# Patient Record
Sex: Female | Born: 1995 | Race: White | Hispanic: Yes | Marital: Single | Smoking: Current every day smoker
Health system: Southern US, Community
[De-identification: ages and names within clinical notes are randomized; demographics above are authoritative.]

## PROBLEM LIST (undated history)

## (undated) DIAGNOSIS — R519 Headache, unspecified: Secondary | ICD-10-CM

## (undated) DIAGNOSIS — D6851 Activated protein C resistance: Secondary | ICD-10-CM

## (undated) DIAGNOSIS — C801 Malignant (primary) neoplasm, unspecified: Secondary | ICD-10-CM

## (undated) DIAGNOSIS — F419 Anxiety disorder, unspecified: Secondary | ICD-10-CM

## (undated) DIAGNOSIS — F329 Major depressive disorder, single episode, unspecified: Secondary | ICD-10-CM

## (undated) DIAGNOSIS — F410 Panic disorder [episodic paroxysmal anxiety] without agoraphobia: Secondary | ICD-10-CM

## (undated) DIAGNOSIS — F32A Depression, unspecified: Secondary | ICD-10-CM

## (undated) DIAGNOSIS — K219 Gastro-esophageal reflux disease without esophagitis: Secondary | ICD-10-CM

## (undated) HISTORY — PX: ABDOMINAL HYSTERECTOMY: SHX81

## (undated) HISTORY — PX: ENDOMETRIAL ABLATION W/ NOVASURE: SUR434

## (undated) HISTORY — PX: UPPER GI ENDOSCOPY: SHX6162

## (undated) HISTORY — PX: WISDOM TOOTH EXTRACTION: SHX21

## (undated) HISTORY — PX: MELANOMA EXCISION: SHX5266

---
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
## 2013-09-19 ENCOUNTER — Ambulatory Visit: Payer: Self-pay | Admitting: Gastroenterology

## 2013-09-19 LAB — HCG, QUANTITATIVE, PREGNANCY: Beta Hcg, Quant.: <1 m[IU]/mL — ABNORMAL LOW

## 2013-09-23 ENCOUNTER — Ambulatory Visit: Payer: Self-pay | Admitting: Gastroenterology

## 2013-09-25 LAB — PATHOLOGY REPORT

## 2014-11-04 ENCOUNTER — Other Ambulatory Visit: Payer: Self-pay | Admitting: Obstetrics and Gynecology

## 2014-11-04 NOTE — H&P (Signed)
Latoya Kelley is a 19 y.o. female here for Pain .  Referral from [facility] for hx of long-standing pelvic pain, with presumptive dx of endometriosis. Also hx of depression with SI/SA and depression. 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  Has a Skyla in place since 2013. Hx of same sex relationship, though is not currently sexually active. Her periods are very light and minimally crampy, which is a vast improvement over past pain and menorrhagia. She is not planning pregnancy in next 5 years at this point.  Present with grandmother Lelon Frohlich. Not in school currently though did do first semester at [employer].   Has considered Lupron in past. Has failed COC, POP and now Skyla, though her periods are very light, her pelvic pain is present. She desires dx surgery.  We did discuss Lupron may have theoretical risk of DVT, but that in absence of personal hx (and likely heterozygote) her risks are small. She has seen hematology in past, but they discharged her. They did place her on ASA which she takes intermittently.  Past Medical History:  has a past medical history of Factor V Leiden; Depression; and Dissociative identity disorder.  Past Surgical History:  has past surgical history that includes egd (09/23/2013). Family History: family history includes Depression in her paternal grandmother; Diabetes type II in her paternal grandmother; Factor V Leiden deficiency in her mother; Hypertension in her mother and paternal grandmother; Lung disease in her maternal grandmother. Social History:  reports that she has never smoked. She does not have any smokeless tobacco history on file. She reports that she does not drink alcohol or use illicit drugs. OB/GYN History:  OB History    Gravida Para Term Preterm AB TAB SAB Ectopic Multiple Living   0 0 0 0 0 0 0 0 0 0       Allergies: has No Known  Allergies. Medications:  Current outpatient prescriptions:  . escitalopram oxalate (LEXAPRO) 10 MG tablet, Take 2 tablets (20 mg total) by mouth once daily., Disp: 30 tablet, Rfl: 3 . gabapentin (NEURONTIN) 300 MG capsule, Take 300 mg by mouth 3 (three) times daily., Disp: , Rfl:  . hydrOXYzine (ATARAX) 25 MG tablet, Take 1 tablet (25 mg total) by mouth 3 (three) times daily as needed for Itching., Disp: 90 tablet, Rfl: prn . levonorgestrel (SKYLA) 14 mcg/24 hour (3 years) IUD IUD, Insert 1 each into the uterus once., Disp: , Rfl:  . meloxicam (MOBIC) 7.5 MG tablet, Take 1 tablet (7.5 mg total) by mouth once daily., Disp: 30 tablet, Rfl: 3 . OMEPRAZOLE ORAL, Take by mouth., Disp: , Rfl:  . OXcarbazepine (TRILEPTAL) 150 MG tablet, Take 150 mg by mouth 2 (two) times daily., Disp: , Rfl:  . paliperidone (INVEGA) 9 MG 24 hr tablet, Take 9 mg by mouth every morning., Disp: , Rfl:  . risperiDONE (RISPERDAL) 3 MG tablet, Take 3 mg by mouth once daily., Disp: , Rfl:  . topiramate (TOPAMAX) 25 MG tablet, Take 25 mg by mouth once daily., Disp: , Rfl:  . traMADol (ULTRAM) 50 mg tablet, Take 1 tablet (50 mg total) by mouth every 6 (six) hours as needed for Pain., Disp: 30 tablet, Rfl: 1 . traZODone (DESYREL) 50 MG tablet, Take 50 mg by mouth nightly., Disp: , Rfl:   Review of Systems: See HPI   Exam:   Filed Vitals:   11/04/14 1428  BP: 112/72  Pulse: 78  WDWN white female in NAD Body mass index is 39.73 kg/(m^2). Lungs: CTA  CV : RRR without murmur  Neck: no thyromegaly Abdomen: soft , no mass, normal active bowel sounds, non-tender, no rebound tenderness  Pelvic:  Deferred at patient's request  Impression:   The primary encounter diagnosis was Pelvic pain in female. Diagnoses of Factor V Leiden, Endometriosis, and Encounter for other general counseling or advice on contraception were also pertinent to this visit.    Plan:   - Pelvic  pain, ddx endometriosis: Per patient's request, interestd in dx laparoscopy. We did discuss her long-standing hx of pelvic pain, possible etiologies, what treatments she has tried. We reviewed her medical hx, surgical hx, risks with surgery. She has failed medical management.  Will replace Skyla with Mirena at the same time. Not interested in fertility in next several years, but levonorgestrel IUD helps with her AUB. She knows that not all women have improvement of sx after surgery, but I will remove any endometriosis I can find safely.  - Separately from the new patient evaluation for pelvic pain, Today we also had a preoperative discussion regarding her plans to proceed with surgical treatment of her suspected endometriosis by dx lap with fulguration procedure.Will also replace Skyla with Mirena. The patient and I discussed the technical aspects of the procedure including the potential for risks and complications. These include but are not limited to the risk of infection requiring post-operative antibiotics or further procedures. We talked about the risk of injury to adjacent organs including bladder, bowel, ureter, blood vessels or nerves. We talked about the need to convert to an open incision. We talked about the possible need for blood transfusion. We talked aboutpostop complications such asthromboembolic or cardiopulmonary complications. All of her questions were answered. Her preoperative exam was completed and the appropriate consents were signed. She is scheduled to undergo this procedure in the near future.  Stop the ASA 7 days prior to surgery. Plan for early ambulation after surgery. Plan for same day surgery.  RTC 2 weeks postop  Sheylin Scharnhorst Monika Salk, MD

## 2014-11-10 ENCOUNTER — Other Ambulatory Visit: Payer: Self-pay

## 2014-11-10 ENCOUNTER — Inpatient Hospital Stay: Admission: RE | Admit: 2014-11-10 | Payer: Self-pay | Source: Ambulatory Visit

## 2014-11-10 ENCOUNTER — Encounter: Payer: Self-pay | Admitting: *Deleted

## 2014-11-10 DIAGNOSIS — Z79899 Other long term (current) drug therapy: Secondary | ICD-10-CM | POA: Diagnosis not present

## 2014-11-10 DIAGNOSIS — D6851 Activated protein C resistance: Secondary | ICD-10-CM | POA: Diagnosis not present

## 2014-11-10 DIAGNOSIS — Z791 Long term (current) use of non-steroidal anti-inflammatories (NSAID): Secondary | ICD-10-CM | POA: Diagnosis not present

## 2014-11-10 DIAGNOSIS — R102 Pelvic and perineal pain: Secondary | ICD-10-CM | POA: Diagnosis present

## 2014-11-10 DIAGNOSIS — G8929 Other chronic pain: Secondary | ICD-10-CM | POA: Diagnosis not present

## 2014-11-10 DIAGNOSIS — F329 Major depressive disorder, single episode, unspecified: Secondary | ICD-10-CM | POA: Diagnosis not present

## 2014-11-10 DIAGNOSIS — K219 Gastro-esophageal reflux disease without esophagitis: Secondary | ICD-10-CM | POA: Diagnosis not present

## 2014-11-10 DIAGNOSIS — Z6838 Body mass index (BMI) 38.0-38.9, adult: Secondary | ICD-10-CM | POA: Diagnosis not present

## 2014-11-10 DIAGNOSIS — E669 Obesity, unspecified: Secondary | ICD-10-CM | POA: Diagnosis not present

## 2014-11-10 DIAGNOSIS — F17200 Nicotine dependence, unspecified, uncomplicated: Secondary | ICD-10-CM | POA: Diagnosis not present

## 2014-11-10 NOTE — Patient Instructions (Signed)
  Your procedure is scheduled on:11/14/14 Report to Day Surgery. To find out your arrival time please call [phone] between 1PM - 3PM on 11/13/14.  Remember: Instructions that are not followed completely may result in serious medical risk, up to and including death, or upon the discretion of your surgeon and anesthesiologist your surgery may need to be rescheduled.    _x___ 1. Do not eat food or drink liquids after midnight. No gum chewing or hard candies.     __x__ 2. No Alcohol for 24 hours before or after surgery.   ____ 3. Bring all medications with you on the day of surgery if instructed.    __x__ 4. Notify your doctor if there is any change in your medical condition     (cold, fever, infections).     Do not wear jewelry, make-up, hairpins, clips or nail polish.  Do not wear lotions, powders, or perfumes. You may wear deodorant.  Do not shave 48 hours prior to surgery. Men may shave face and neck.  Do not bring valuables to the hospital.    [facility] is not responsible for any belongings or valuables.               Contacts, dentures or bridgework may not be worn into surgery.  Leave your suitcase in the car. After surgery it may be brought to your room.  For patients admitted to the hospital, discharge time is determined by your                treatment team.   Patients discharged the day of surgery will not be allowed to drive home.   Please read over the following fact sheets that you were given:   Surgical Site Infection Prevention   ____ Take these medicines the morning of surgery with A SIP OF WATER:    1.   2.   3.   4.  5.  6.  ____ Fleet Enema (as directed)   __x__ Use CHG Soap as directed  ____ Use inhalers on the day of surgery  ____ Stop metformin 2 days prior to surgery    ____ Take 1/2 of usual insulin dose the night before surgery and none on the morning of surgery.   ____ Stop Coumadin/Plavix/aspirin on ____ Stop Anti-inflammatories on     ____ Stop supplements until after surgery.    ____ Bring C-Pap to the hospital.

## 2014-11-10 NOTE — OR Nursing (Signed)
Dr Myra Gianotti notified of factor 5 leiden and patient states hematologist did not recommend followup unless issues experienced with bleeding

## 2014-11-13 ENCOUNTER — Encounter
Admission: RE | Admit: 2014-11-13 | Discharge: 2014-11-13 | Disposition: A | Discharge status: Home / Self Care | Payer: Managed Care, Other (non HMO) | Source: Ambulatory Visit | Attending: Obstetrics and Gynecology | Admitting: Obstetrics and Gynecology

## 2014-11-13 DIAGNOSIS — G8929 Other chronic pain: Secondary | ICD-10-CM | POA: Diagnosis not present

## 2014-11-13 LAB — BASIC METABOLIC PANEL
Anion gap: 8 (ref 5–15)
BUN: 13 mg/dL (ref 6–20)
CO2: 23 mmol/L (ref 22–32)
CREATININE: 0.86 mg/dL (ref 0.44–1.00)
Calcium: 8.6 mg/dL — ABNORMAL LOW (ref 8.9–10.3)
Chloride: 105 mmol/L (ref 101–111)
GFR calc Af Amer: >60 mL/min (ref >60)
Glucose, Bld: 88 mg/dL (ref 65–99)
POTASSIUM: 3.9 mmol/L (ref 3.5–5.1)
Sodium: 136 mmol/L (ref 135–145)

## 2014-11-13 LAB — CBC
HCT: 46.1 % (ref 35.0–47.0)
Hemoglobin: 15 g/dL (ref 12.0–16.0)
MCH: 29.1 pg (ref 26.0–34.0)
MCHC: 32.5 g/dL (ref 32.0–36.0)
MCV: 89.4 fL (ref 80.0–100.0)
PLATELETS: 267 10*3/uL (ref 150–440)
RBC: 5.16 MIL/uL (ref 3.80–5.20)
RDW: 13.5 % (ref 11.5–14.5)
WBC: 8 10*3/uL (ref 3.6–11.0)

## 2014-11-13 LAB — TYPE AND SCREEN
ABO/RH(D): A NEG
Antibody Screen: NEGATIVE

## 2014-11-13 LAB — PREGNANCY, URINE: PREG TEST UR: NEGATIVE

## 2014-11-14 ENCOUNTER — Encounter: Payer: Self-pay | Admitting: *Deleted

## 2014-11-14 ENCOUNTER — Encounter
Admission: RE | Disposition: A | Discharge status: Home / Self Care | Payer: Self-pay | Source: Ambulatory Visit | Attending: Obstetrics and Gynecology

## 2014-11-14 ENCOUNTER — Ambulatory Visit: Payer: Managed Care, Other (non HMO) | Admitting: Anesthesiology

## 2014-11-14 ENCOUNTER — Ambulatory Visit
Admission: RE | Admit: 2014-11-14 | Discharge: 2014-11-14 | Disposition: A | Discharge status: Home / Self Care | Payer: Managed Care, Other (non HMO) | Source: Ambulatory Visit | Attending: Obstetrics and Gynecology | Admitting: Obstetrics and Gynecology

## 2014-11-14 DIAGNOSIS — Z791 Long term (current) use of non-steroidal anti-inflammatories (NSAID): Secondary | ICD-10-CM | POA: Insufficient documentation

## 2014-11-14 DIAGNOSIS — F17200 Nicotine dependence, unspecified, uncomplicated: Secondary | ICD-10-CM | POA: Insufficient documentation

## 2014-11-14 DIAGNOSIS — D6851 Activated protein C resistance: Secondary | ICD-10-CM | POA: Insufficient documentation

## 2014-11-14 DIAGNOSIS — G8929 Other chronic pain: Secondary | ICD-10-CM | POA: Diagnosis not present

## 2014-11-14 DIAGNOSIS — Z79899 Other long term (current) drug therapy: Secondary | ICD-10-CM | POA: Insufficient documentation

## 2014-11-14 DIAGNOSIS — E669 Obesity, unspecified: Secondary | ICD-10-CM | POA: Insufficient documentation

## 2014-11-14 DIAGNOSIS — R102 Pelvic and perineal pain: Secondary | ICD-10-CM | POA: Insufficient documentation

## 2014-11-14 DIAGNOSIS — K219 Gastro-esophageal reflux disease without esophagitis: Secondary | ICD-10-CM | POA: Insufficient documentation

## 2014-11-14 DIAGNOSIS — F329 Major depressive disorder, single episode, unspecified: Secondary | ICD-10-CM | POA: Insufficient documentation

## 2014-11-14 DIAGNOSIS — Z6838 Body mass index (BMI) 38.0-38.9, adult: Secondary | ICD-10-CM | POA: Insufficient documentation

## 2014-11-14 HISTORY — PX: IUD REMOVAL: SHX5392

## 2014-11-14 HISTORY — PX: LAPAROSCOPY: SHX197

## 2014-11-14 HISTORY — DX: Depression, unspecified: F32.A

## 2014-11-14 HISTORY — DX: Gastro-esophageal reflux disease without esophagitis: K21.9

## 2014-11-14 HISTORY — DX: Major depressive disorder, single episode, unspecified: F32.9

## 2014-11-14 HISTORY — PX: INTRAUTERINE DEVICE (IUD) INSERTION: SHX5877

## 2014-11-14 HISTORY — DX: Anxiety disorder, unspecified: F41.9

## 2014-11-14 HISTORY — DX: Activated protein C resistance: D68.51

## 2014-11-14 HISTORY — DX: Panic disorder (episodic paroxysmal anxiety): F41.0

## 2014-11-14 LAB — ABO/RH: ABO/RH(D): A NEG

## 2014-11-14 LAB — POCT PREGNANCY, URINE: PREG TEST UR: NEGATIVE

## 2014-11-14 SURGERY — REMOVAL, INTRAUTERINE DEVICE
Anesthesia: General

## 2014-11-14 MED ORDER — LACTATED RINGERS IV SOLN
INTRAVENOUS | Status: DC
  Administered 2014-11-14: 10:00:00 via INTRAVENOUS

## 2014-11-14 MED ORDER — ONDANSETRON HCL 4 MG/2ML IJ SOLN
INTRAMUSCULAR | Status: AC
  Administered 2014-11-14: 4 mg via INTRAVENOUS
  Filled 2014-11-14: qty 2

## 2014-11-14 MED ORDER — BUPIVACAINE HCL 0.5 % IJ SOLN
INTRAMUSCULAR | Status: DC | PRN
  Administered 2014-11-14: 10 mL

## 2014-11-14 MED ORDER — GLYCOPYRROLATE 0.2 MG/ML IJ SOLN
INTRAMUSCULAR | Status: DC | PRN
  Administered 2014-11-14: 0.6 mg via INTRAVENOUS

## 2014-11-14 MED ORDER — SILVER NITRATE-POT NITRATE 75-25 % EX MISC
CUTANEOUS | Status: AC
  Filled 2014-11-14: qty 4

## 2014-11-14 MED ORDER — FENTANYL CITRATE (PF) 100 MCG/2ML IJ SOLN
INTRAMUSCULAR | Status: AC
  Administered 2014-11-14: 25 ug via INTRAVENOUS
  Filled 2014-11-14: qty 2

## 2014-11-14 MED ORDER — DOCUSATE SODIUM 100 MG PO CAPS: 100.0000 mg | ORAL_CAPSULE | Freq: Two times a day (BID) | ORAL | Status: DC | PRN

## 2014-11-14 MED ORDER — KETOROLAC TROMETHAMINE 30 MG/ML IJ SOLN
30.0000 mg | Freq: Once | INTRAMUSCULAR | Status: AC
  Administered 2014-11-14: 30 mg via INTRAVENOUS

## 2014-11-14 MED ORDER — ONDANSETRON HCL 4 MG/2ML IJ SOLN
4.0000 mg | Freq: Once | INTRAMUSCULAR | Status: AC | PRN
  Administered 2014-11-14: 4 mg via INTRAVENOUS

## 2014-11-14 MED ORDER — IBUPROFEN 800 MG PO TABS: 800.0000 mg | ORAL_TABLET | Freq: Three times a day (TID) | ORAL | Status: DC | PRN

## 2014-11-14 MED ORDER — FAMOTIDINE 20 MG PO TABS
ORAL_TABLET | ORAL | Status: AC
  Administered 2014-11-14: 20 mg via ORAL
  Filled 2014-11-14: qty 1

## 2014-11-14 MED ORDER — MIDAZOLAM HCL 2 MG/2ML IJ SOLN
INTRAMUSCULAR | Status: DC | PRN
Start: 2014-11-14 — End: 2014-11-14
  Administered 2014-11-14 (×2): 1 mg via INTRAVENOUS

## 2014-11-14 MED ORDER — LACTATED RINGERS IV SOLN
INTRAVENOUS | Status: DC
  Administered 2014-11-14: 09:00:00 via INTRAVENOUS

## 2014-11-14 MED ORDER — FAMOTIDINE 20 MG PO TABS
20.0000 mg | ORAL_TABLET | Freq: Once | ORAL | Status: AC
  Administered 2014-11-14: 20 mg via ORAL

## 2014-11-14 MED ORDER — OXYCODONE-ACETAMINOPHEN 5-325 MG PO TABS: 1.0000 | ORAL_TABLET | Freq: Four times a day (QID) | ORAL | Status: DC | PRN

## 2014-11-14 MED ORDER — LIDOCAINE HCL (CARDIAC) 20 MG/ML IV SOLN
INTRAVENOUS | Status: DC | PRN
  Administered 2014-11-14: 40 mg via INTRAVENOUS

## 2014-11-14 MED ORDER — FENTANYL CITRATE (PF) 100 MCG/2ML IJ SOLN
25.0000 ug | INTRAMUSCULAR | Status: AC | PRN
  Administered 2014-11-14 (×6): 25 ug via INTRAVENOUS

## 2014-11-14 MED ORDER — PROPOFOL 10 MG/ML IV BOLUS
INTRAVENOUS | Status: DC | PRN
  Administered 2014-11-14: 200 mg via INTRAVENOUS

## 2014-11-14 MED ORDER — KETOROLAC TROMETHAMINE 30 MG/ML IJ SOLN
INTRAMUSCULAR | Status: AC
  Administered 2014-11-14: 30 mg via INTRAVENOUS
  Filled 2014-11-14: qty 1

## 2014-11-14 MED ORDER — FENTANYL CITRATE (PF) 100 MCG/2ML IJ SOLN
INTRAMUSCULAR | Status: DC | PRN
  Administered 2014-11-14 (×2): 50 ug via INTRAVENOUS

## 2014-11-14 MED ORDER — BUPIVACAINE HCL (PF) 0.5 % IJ SOLN
INTRAMUSCULAR | Status: AC
  Filled 2014-11-14: qty 30

## 2014-11-14 MED ORDER — ROCURONIUM BROMIDE 100 MG/10ML IV SOLN
INTRAVENOUS | Status: DC | PRN
  Administered 2014-11-14: 40 mg via INTRAVENOUS

## 2014-11-14 MED ORDER — NEOSTIGMINE METHYLSULFATE 10 MG/10ML IV SOLN
INTRAVENOUS | Status: DC | PRN
  Administered 2014-11-14: 3 mg via INTRAVENOUS

## 2014-11-14 SURGICAL SUPPLY — 38 items
BAG URO DRAIN 2000ML W/SPOUT (MISCELLANEOUS) ×2 IMPLANT
BLADE SURG SZ11 CARB STEEL (BLADE) ×2 IMPLANT
CATH FOLEY 2WAY  5CC 16FR (CATHETERS) ×1
CATH ROBINSON RED A/P 16FR (CATHETERS) ×2 IMPLANT
CATH URTH 16FR FL 2W BLN LF (CATHETERS) ×1 IMPLANT
CHLORAPREP W/TINT 26ML (MISCELLANEOUS) ×2 IMPLANT
DRSG TEGADERM 2-3/8X2-3/4 SM (GAUZE/BANDAGES/DRESSINGS) ×2 IMPLANT
ENDOPOUCH RETRIEVER 10 (MISCELLANEOUS) IMPLANT
GAUZE SPONGE NON-WVN 2X2 STRL (MISCELLANEOUS) ×1 IMPLANT
GLOVE BIO SURGEON STRL SZ 6.5 (GLOVE) ×6 IMPLANT
GLOVE BIO SURGEON STRL SZ7 (GLOVE) ×2 IMPLANT
GLOVE INDICATOR 6.5 STRL GRN (GLOVE) ×2 IMPLANT
GLOVE INDICATOR 7.0 STRL GRN (GLOVE) ×6 IMPLANT
GOWN STRL REUS W/ TWL LRG LVL3 (GOWN DISPOSABLE) ×2 IMPLANT
GOWN STRL REUS W/TWL LRG LVL3 (GOWN DISPOSABLE) ×2
IRRIGATION STRYKERFLOW (MISCELLANEOUS) ×1 IMPLANT
IRRIGATOR STRYKERFLOW (MISCELLANEOUS) ×2
KIT RM TURNOVER CYSTO AR (KITS) ×2 IMPLANT
LABEL OR SOLS (LABEL) ×2 IMPLANT
NS IRRIG 500ML POUR BTL (IV SOLUTION) ×2 IMPLANT
PACK DNC HYST (MISCELLANEOUS) ×2 IMPLANT
PACK GYN LAPAROSCOPIC (MISCELLANEOUS) ×2 IMPLANT
PAD OB MATERNITY 4.3X12.25 (PERSONAL CARE ITEMS) ×2 IMPLANT
PAD PREP 24X41 OB/GYN DISP (PERSONAL CARE ITEMS) ×2 IMPLANT
SHEARS HARMONIC ACE PLUS 36CM (ENDOMECHANICALS) ×2 IMPLANT
SLEEVE ENDOPATH XCEL 5M (ENDOMECHANICALS) ×4 IMPLANT
SPONGE VERSALON 2X2 STRL (MISCELLANEOUS) ×1
STRIP CLOSURE SKIN 1/4X4 (GAUZE/BANDAGES/DRESSINGS) ×2 IMPLANT
SUT VIC AB 2-0 UR6 27 (SUTURE) IMPLANT
SUT VIC AB 4-0 SH 27 (SUTURE) ×1
SUT VIC AB 4-0 SH 27XANBCTRL (SUTURE) ×1 IMPLANT
SWABSTK COMLB BENZOIN TINCTURE (MISCELLANEOUS) ×2 IMPLANT
TOWEL OR 17X26 4PK STRL BLUE (TOWEL DISPOSABLE) ×2 IMPLANT
TROCAR 5M 150ML BLDLS (TROCAR) ×2 IMPLANT
TROCAR ENDO BLADELESS 11MM (ENDOMECHANICALS) IMPLANT
TROCAR XCEL NON-BLD 5MMX100MML (ENDOMECHANICALS) ×2 IMPLANT
TROCAR XCEL UNIV SLVE 11M 100M (ENDOMECHANICALS) IMPLANT
TUBING INSUFFLATOR HI FLOW (MISCELLANEOUS) ×2 IMPLANT

## 2014-11-14 NOTE — Transfer of Care (Signed)
Immediate Anesthesia Transfer of Care Note  Patient: Latoya Kelley  Procedure(s) Performed: Procedure(s): INTRAUTERINE DEVICE (IUD) REMOVAL (N/A) INTRAUTERINE DEVICE (IUD) INSERTION (N/A) LAPAROSCOPY DIAGNOSTIC (N/A)  Patient Location: PACU  Anesthesia Type:General  Level of Consciousness: awake, oriented and patient cooperative  Airway & Oxygen Therapy: Patient Spontanous Breathing and Patient connected to face mask oxygen  Post-op Assessment: Report given to RN and Post -op Vital signs reviewed and stable  Post vital signs: Reviewed  Last Vitals:  Filed Vitals:   11/14/14 1054  BP: 115/85  Pulse: 78  Temp: 36.1 C  Resp: 21    Complications: No apparent anesthesia complications

## 2014-11-14 NOTE — Discharge Instructions (Signed)
Diagnostic Laparoscopy Laparoscopy is a surgical procedure. It is used to diagnose and treat diseases inside the belly (abdomen). It is usually a brief, common, and relatively simple procedure. The laparoscopeis a thin, lighted, pencil-sized instrument. It is like a telescope. It is inserted into your abdomen through a small cut (incision). Your caregiver can look at the organs inside your body through this instrument. He or she can see if there is anything abnormal. Laparoscopy can be done either in a hospital or outpatient clinic. You may be given a mild sedative to help you relax before the procedure. Once in the operating room, you will be given a drug to make you sleep (general anesthesia). Laparoscopy usually lasts less than 1 hour. After the procedure, you will be monitored in a recovery area until you are stable and doing well. Once you are home, it will take 2 to 3 days to fully recover. RISKS AND COMPLICATIONS  Laparoscopy has relatively few risks. Your caregiver will discuss the risks with you before the procedure. Some problems that can occur include:  Infection.  Bleeding.  Damage to other organs.  Anesthetic side effects. PROCEDURE Once you receive anesthesia, your surgeon inflates the abdomen with a harmless gas (carbon dioxide). This makes the organs easier to see. The laparoscope is inserted into the abdomen through a small incision. This allows your surgeon to see into the abdomen. Other small instruments are also inserted into the abdomen through other small openings. Many surgeons attach a video camera to the laparoscope to enlarge the view. During a diagnostic laparoscopy, the surgeon may be looking for inflammation, infection, or cancer. Your surgeon may take tissue samples(biopsies). The samples are sent to a specialist in looking at cells and tissue samples (pathologist). The pathologist examines them under a microscope. Biopsies can help to diagnose or confirm a  disease. AFTER THE PROCEDURE   The gas is released from inside the abdomen.  The incisions are closed with stitches (sutures). Because these incisions are small (usually less than 1/2 inch), there is usually minimal discomfort after the procedure. There may be some mild discomfort in the throat. This is from the tube placed in the throat while you were sleeping. You may have some mild abdominal discomfort. There may also be discomfort from the instrument placement incisions in the abdomen.  The recovery time is shortened as long as there are no complications.  You will rest in a recovery room until stable and doing well. As long as there are no complications, you may be allowed to go home. FINDING OUT THE RESULTS OF YOUR TEST Not all test results are available during your visit. If your test results are not back during the visit, make an appointment with your caregiver to find out the results. Do not assume everything is normal if you have not heard from your caregiver or the medical facility. It is important for you to follow up on all of your test results.  HOME CARE INSTRUCTIONS   Take all medicines as directed.  Only take over-the-counter or prescription medicines for pain, discomfort, or fever as directed by your caregiver.  Resume daily activities as directed.  Showers are preferred over baths.  You may resume sexual activities in 1 week or as directed.  Do not drive while taking narcotics. SEEK MEDICAL CARE IF:   There is increasing abdominal pain.  There is new pain in the shoulders (shoulder strap areas).  You feel lightheaded or faint.  You have the chills.  You  or your child has an oral temperature above 102 F (38.9 C).  There is pus-like (purulent) drainage from any of the wounds.  You are unable to pass gas or have a bowel movement.  You feel sick to your stomach (nauseous) or throw up (vomit). MAKE SURE YOU:   Understand these instructions.  Will watch  your condition.  Will get help right away if you are not doing well or get worse. Document Released: 08/15/2000 Document Revised: 09/03/2012 Document Reviewed: 05/09/2007 [facility] Patient Information 2015 [place], [place]. This information is not intended to replace advice given to you by your health care provider. Make sure you discuss any questions you have with your health care provider.  General Anesthesia, Care After Refer to this sheet in the next few weeks. These instructions provide you with information on caring for yourself after your procedure. Your health care provider may also give you more specific instructions. Your treatment has been planned according to current medical practices, but problems sometimes occur. Call your health care provider if you have any problems or questions after your procedure. WHAT TO EXPECT AFTER THE PROCEDURE After the procedure, it is typical to experience: Sleepiness. Nausea and vomiting. HOME CARE INSTRUCTIONS For the first 24 hours after general anesthesia: Have a responsible person with you. Do not drive a car. If you are alone, do not take public transportation. Do not drink alcohol. Do not take medicine that has not been prescribed by your health care provider. Do not sign important papers or make important decisions. You may resume a normal diet and activities as directed by your health care provider. Change bandages (dressings) as directed. If you have questions or problems that seem related to general anesthesia, call the hospital and ask for the anesthetist or anesthesiologist on call. SEEK MEDICAL CARE IF: You have nausea and vomiting that continue the day after anesthesia. You develop a rash. SEEK IMMEDIATE MEDICAL CARE IF:  You have difficulty breathing. You have chest pain. You have any allergic problems. Document Released: 08/15/2000 Document Revised: 05/14/2013 Document Reviewed: 11/22/2012 [facility] Patient Information 2015  [place], [place]. This information is not intended to replace advice given to you by your health care provider. Make sure you discuss any questions you have with your health care provider.

## 2014-11-14 NOTE — Anesthesia Procedure Notes (Signed)
Procedure Name: Intubation Date/Time: 11/14/2014 9:56 AM Performed by: Courtney Paris Pre-anesthesia Checklist: Patient identified, Emergency Drugs available, Suction available, Patient being monitored and Timeout performed Patient Re-evaluated:Patient Re-evaluated prior to inductionOxygen Delivery Method: Circle system utilized Preoxygenation: Pre-oxygenation with 100% oxygen Intubation Type: Combination inhalational/ intravenous induction and IV induction Ventilation: Mask ventilation without difficulty Laryngoscope Size: Mac and 4 Grade View: Grade II Tube type: Oral Tube size: 7.0 mm Number of attempts: 2 Airway Equipment and Method: Stylet Placement Confirmation: ETT inserted through vocal cords under direct vision,  positive ETCO2,  CO2 detector and breath sounds checked- equal and bilateral Secured at: 20 cm Tube secured with: Tape Dental Injury: Teeth and Oropharynx as per pre-operative assessment  Difficulty Due To: Difficult Airway- due to large tongue

## 2014-11-14 NOTE — Op Note (Signed)
Latoya Kelley PROCEDURE DATE: 11/14/2014  PREOPERATIVE DIAGNOSIS: Chronic pelvic pain POSTOPERATIVE DIAGNOSIS:  Same PROCEDURE:  - Diagnostic laparoscopy - IUD removal - IUD placement  SURGEON:  Dr. Benjaman Kindler  INDICATIONS: 19 y.o. G0 with history of chronic pelvic pain concerning for endometriosis desiring surgical evaluation.   Please see preoperative notes for further details.  Risks of surgery were discussed with the patient including but not limited to: bleeding which may require transfusion or reoperation; infection which may require antibiotics; injury to bowel, bladder, ureters or other surrounding organs; need for additional procedures including laparotomy; thromboembolic phenomenon, incisional problems and other postoperative/anesthesia complications. Written informed consent was obtained.    FINDINGS:  Normal ovaries and fallopian tubes bilaterally.  No evidence of erythema, peritoneal implants or endometriosis noted in pelvis or abdomen.  The uterus appeared to have globular enlargement with doughy appearance. No other abdominal/pelvic abnormality.  Normal upper abdomen. Normal appendix.   ANESTHESIA:    General INTRAVENOUS FLUIDS: 600 ml ESTIMATED BLOOD LOSS: minimal URINE OUTPUT: 200 ml SPECIMENS: None COMPLICATIONS: None immediate  PROCEDURE IN DETAIL:  The patient had sequential compression devices applied to her lower extremities while in the preoperative area.  She was then taken to the operating room where general anesthesia was administered and was found to be adequate.  She was placed in the dorsal lithotomy position, and was prepped and draped in a sterile manner.    After an adequate timeout was performed, a Foley catheter was inserted into her bladder and attached to constant drainage. The strings of the Skyla were visualized and grasped with ring forceps. IUD was removed without difficulty. A uterine manipulator was then advanced into the uterus .     Attention was turned to the abdomen where an umbilical incision was made with the scalpel.  The Optiview 11-mm trocar and sleeve were then advanced without difficulty with the laparoscope under direct visualization into the abdomen.  The abdomen was then insufflated with carbon dioxide gas and adequate pneumoperitoneum was obtained.   A detailed survey of the patient's pelvis and abdomen revealed the findings as mentioned above.  The operative site was surveyed, and it was found to be hemostatic.  No intraoperative injury to surrounding organs was noted.  The abdomen was desufflated and all instruments were then removed from the patient's abdomen. The uterine manipulator was removed without complications.  All incisions were closed with Dermabond. The patient tolerated the procedures well.  All instruments, needles, and sponge counts were correct x 2. The patient was taken to the recovery room in stable condition.

## 2014-11-14 NOTE — OR Nursing (Signed)
Mirena, 52mg , levonorgestrel-releasing intrauterine system Lot:  [id number], exp 04/2017

## 2014-11-14 NOTE — Anesthesia Postprocedure Evaluation (Signed)
  Anesthesia Post-op Note  Patient: Latoya Kelley  Procedure(s) Performed: Procedure(s): INTRAUTERINE DEVICE (IUD) REMOVAL (N/A) INTRAUTERINE DEVICE (IUD) INSERTION (N/A) LAPAROSCOPY DIAGNOSTIC (N/A)  Anesthesia type:General  Patient location: PACU  Post pain: Pain level controlled  Post assessment: Post-op Vital signs reviewed, Patient's Cardiovascular Status Stable, Respiratory Function Stable, Patent Airway and No signs of Nausea or vomiting  Post vital signs: Reviewed and stable  Last Vitals:  Filed Vitals:   11/14/14 1058  BP:   Pulse: 77  Temp:   Resp: 17    Level of consciousness: awake, alert  and patient cooperative  Complications: No apparent anesthesia complications

## 2014-11-14 NOTE — Anesthesia Preprocedure Evaluation (Addendum)
Anesthesia Evaluation  Patient identified by MRN, date of birth, ID band Patient awake    Reviewed: Allergy & Precautions, NPO status , Patient's Chart, lab work & pertinent test results  History of Anesthesia Complications Negative for: history of anesthetic complications  Airway Mallampati: II       Dental no notable dental hx.    Pulmonary Current Smoker,    Pulmonary exam normal       Cardiovascular negative cardio ROS Normal cardiovascular exam    Neuro/Psych negative neurological ROS     GI/Hepatic Neg liver ROS, GERD-  ,  Endo/Other  negative endocrine ROS  Renal/GU negative Renal ROS  negative genitourinary   Musculoskeletal negative musculoskeletal ROS (+)   Abdominal (+) + obese,   Peds negative pediatric ROS (+)  Hematology negative hematology ROS (+)   Anesthesia Other Findings   Reproductive/Obstetrics negative OB ROS                            Anesthesia Physical Anesthesia Plan  ASA: II  Anesthesia Plan: General   Post-op Pain Management:    Induction: Intravenous  Airway Management Planned: Oral ETT  Additional Equipment:   Intra-op Plan:   Post-operative Plan: Extubation in OR  Informed Consent: I have reviewed the patients History and Physical, chart, labs and discussed the procedure including the risks, benefits and alternatives for the proposed anesthesia with the patient or authorized representative who has indicated his/her understanding and acceptance.     Plan Discussed with: CRNA  Anesthesia Plan Comments:        Anesthesia Quick Evaluation

## 2014-11-14 NOTE — H&P (Signed)
  Date of Initial H&P: 09/30/14  History reviewed, patient examined, no change in status, stable for surgery.  Paper chart H&P completed and will be scanned into EMR.

## 2015-06-12 ENCOUNTER — Ambulatory Visit: Payer: Commercial Managed Care - PPO | Attending: Obstetrics and Gynecology | Admitting: Physical Therapy

## 2015-06-12 ENCOUNTER — Encounter: Payer: Self-pay | Admitting: Physical Therapy

## 2015-06-12 DIAGNOSIS — M5416 Radiculopathy, lumbar region: Secondary | ICD-10-CM | POA: Insufficient documentation

## 2015-06-12 DIAGNOSIS — R29898 Other symptoms and signs involving the musculoskeletal system: Secondary | ICD-10-CM | POA: Diagnosis present

## 2015-06-12 DIAGNOSIS — M791 Myalgia: Secondary | ICD-10-CM | POA: Diagnosis present

## 2015-06-12 DIAGNOSIS — R279 Unspecified lack of coordination: Secondary | ICD-10-CM

## 2015-06-12 DIAGNOSIS — IMO0001 Reserved for inherently not codable concepts without codable children: Secondary | ICD-10-CM

## 2015-06-12 DIAGNOSIS — M609 Myositis, unspecified: Secondary | ICD-10-CM | POA: Insufficient documentation

## 2015-06-12 NOTE — Patient Instructions (Signed)
Bend backwards 10x 3 at every 2 hrs for the next 48-72 hr until the radiating back pain moves up and stays at the center of the low back.     Log rolling out of bed
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
## 2015-06-15 NOTE — Addendum Note (Signed)
Addended by: Jerl Mina on: 06/15/2015 10:22 PM   Modules accepted: Orders

## 2015-06-24 ENCOUNTER — Ambulatory Visit: Payer: Commercial Managed Care - PPO | Attending: Obstetrics and Gynecology | Admitting: Physical Therapy

## 2015-06-26 ENCOUNTER — Ambulatory Visit: Payer: Commercial Managed Care - PPO | Admitting: Physical Therapy

## 2015-06-30 ENCOUNTER — Encounter: Payer: Commercial Managed Care - PPO | Admitting: Physical Therapy

## 2015-07-02 ENCOUNTER — Encounter: Payer: Commercial Managed Care - PPO | Admitting: Physical Therapy

## 2015-07-08 ENCOUNTER — Encounter: Payer: Commercial Managed Care - PPO | Admitting: Physical Therapy

## 2015-07-15 ENCOUNTER — Encounter: Payer: Commercial Managed Care - PPO | Admitting: Physical Therapy

## 2015-07-22 ENCOUNTER — Encounter: Payer: Commercial Managed Care - PPO | Admitting: Physical Therapy

## 2015-07-29 ENCOUNTER — Encounter: Payer: Commercial Managed Care - PPO | Admitting: Physical Therapy

## 2015-08-05 ENCOUNTER — Encounter: Payer: Commercial Managed Care - PPO | Admitting: Physical Therapy

## 2015-08-12 ENCOUNTER — Encounter: Payer: Commercial Managed Care - PPO | Admitting: Physical Therapy

## 2016-10-26 ENCOUNTER — Encounter: Payer: Self-pay | Admitting: Hematology and Oncology

## 2016-10-26 ENCOUNTER — Inpatient Hospital Stay: Payer: Managed Care, Other (non HMO)

## 2016-10-26 ENCOUNTER — Inpatient Hospital Stay: Payer: Managed Care, Other (non HMO) | Attending: Hematology and Oncology | Admitting: Hematology and Oncology

## 2016-10-26 VITALS — BP 126/79 | HR 112 | Temp 96.9°F | Resp 18 | Wt 218.9 lb

## 2016-10-26 DIAGNOSIS — N393 Stress incontinence (female) (male): Secondary | ICD-10-CM | POA: Insufficient documentation

## 2016-10-26 DIAGNOSIS — N920 Excessive and frequent menstruation with regular cycle: Secondary | ICD-10-CM | POA: Diagnosis not present

## 2016-10-26 DIAGNOSIS — K219 Gastro-esophageal reflux disease without esophagitis: Secondary | ICD-10-CM | POA: Diagnosis not present

## 2016-10-26 DIAGNOSIS — R6881 Early satiety: Secondary | ICD-10-CM

## 2016-10-26 DIAGNOSIS — F410 Panic disorder [episodic paroxysmal anxiety] without agoraphobia: Secondary | ICD-10-CM | POA: Diagnosis not present

## 2016-10-26 DIAGNOSIS — D6851 Activated protein C resistance: Secondary | ICD-10-CM | POA: Diagnosis not present

## 2016-10-26 DIAGNOSIS — Z79899 Other long term (current) drug therapy: Secondary | ICD-10-CM | POA: Diagnosis not present

## 2016-10-26 DIAGNOSIS — F17210 Nicotine dependence, cigarettes, uncomplicated: Secondary | ICD-10-CM | POA: Diagnosis not present

## 2016-10-26 DIAGNOSIS — D751 Secondary polycythemia: Secondary | ICD-10-CM | POA: Diagnosis not present

## 2016-10-26 DIAGNOSIS — R634 Abnormal weight loss: Secondary | ICD-10-CM

## 2016-10-26 DIAGNOSIS — F329 Major depressive disorder, single episode, unspecified: Secondary | ICD-10-CM | POA: Diagnosis not present

## 2016-10-26 LAB — COMPREHENSIVE METABOLIC PANEL
ALT: 15 U/L (ref 14–54)
AST: 23 U/L (ref 15–41)
Albumin: 4 g/dL (ref 3.5–5.0)
Alkaline Phosphatase: 48 U/L (ref 38–126)
Anion gap: 9 (ref 5–15)
BUN: 5 mg/dL — ABNORMAL LOW (ref 6–20)
CO2: 22 mmol/L (ref 22–32)
Calcium: 9 mg/dL (ref 8.9–10.3)
Chloride: 104 mmol/L (ref 101–111)
Creatinine, Ser: 0.86 mg/dL (ref 0.44–1.00)
GFR calc Af Amer: >60 mL/min (ref >60)
GFR calc non Af Amer: >60 mL/min (ref >60)
Glucose, Bld: 117 mg/dL — ABNORMAL HIGH (ref 65–99)
Potassium: 3.7 mmol/L (ref 3.5–5.1)
Sodium: 135 mmol/L (ref 135–145)
Total Bilirubin: 0.4 mg/dL (ref 0.3–1.2)
Total Protein: 7 g/dL (ref 6.5–8.1)

## 2016-10-26 LAB — FERRITIN: Ferritin: 21 ng/mL (ref 11–307)

## 2016-10-26 NOTE — Progress Notes (Signed)
[facility] day:  10/26/2016  Chief Complaint: Latoya Kelley is a 21 y.o. female with polycythemia who is referred by Ricardo Jericho, NP for assessment and management.  HPI:  The patient states that she found out she had increased red blood cells when she was called to set up this appointment. She states that her blood has been checked in the past and was normal.  CBC on 11/13/2014 revealed a hematocrit of 46.1, hemoglobin 15, MCV 89.4, platelets 267,000 and white count 8000. CBC on 09/23/2016 revealed a hematocrit of 49.1, hemoglobin 16.5, MCV 93, platelets 276,000, white count 8600 with an [place] of 4500. Differential was unremarkable. CBC on 09/29/2016 revealed a hematocrit of 48.3, hemoglobin 16.3, MCV 92, platelets 271,000 and white count 7900.  The patient states that she was diagnosed with Factor V Leiden at the age of 36 due to heavy menses. She saw a hematologist at [facility] 4-5 years ago. No records are available.  It was recommended that she not to take birth control pills. Two sisters, her mother, and grandmother have subsequently been tested and also her heterozygous for Factor V Leiden.  None have had clots. Her mother had 3 miscarriages.  She has smoked a pack/day x 2 years.  She may have sleep apnea.  She "snores like crazy".  She denies any use of testosterone or erythropoietin.  Symptomatically, she is "scared".  She has lost 30 pounds in the past year.  Stress causes emesis.  She has felt like she has had the flu off/on for the past 6 months.  She has had no appetite for 2-3 months.  She has early satiety.  She has had after bath itching for 6-8 months.  She has diarrhea 2-3x/day.  She denies any melena or hematochezia.  She has had stress incontinence x 2 months.   Past Medical History:  Diagnosis Date  . Anxiety   . Depression   . Factor 5 Leiden mutation, heterozygous ([place])   . GERD (gastroesophageal reflux disease)     history by endo  . Panic attack     Past Surgical History:  Procedure Laterality Date  . INTRAUTERINE DEVICE (IUD) INSERTION N/A 11/14/2014   Procedure: INTRAUTERINE DEVICE (IUD) INSERTION;  Surgeon: Benjaman Kindler, MD;  Location: [facility] ORS;  Service: Gynecology;  Laterality: N/A;  . IUD REMOVAL N/A 11/14/2014   Procedure: INTRAUTERINE DEVICE (IUD) REMOVAL;  Surgeon: Benjaman Kindler, MD;  Location: [facility] ORS;  Service: Gynecology;  Laterality: N/A;  . LAPAROSCOPY N/A 11/14/2014   Procedure: LAPAROSCOPY DIAGNOSTIC;  Surgeon: Benjaman Kindler, MD;  Location: [facility] ORS;  Service: Gynecology;  Laterality: N/A;  . UPPER GI ENDOSCOPY    . WISDOM TOOTH EXTRACTION      Family History  Problem Relation Age of Onset  . Hypertension Mother     Social History:  reports that she has been smoking.  She has never used smokeless tobacco. She reports that she does not drink alcohol or use drugs.  She has been smoking 1 pack/day x 2 years.  She drinks alcohol socially 1/month.  She denies any drug use.  She has not had sex in 1 year.  She denies any exposure to radiation or toxins.  She lives independently in [place].  She is a Programme researcher, broadcasting/film/video at [employer].  She has applied to be a Optometrist.  The patient is accompanied by her mother and grandmother today.  Allergies: No Known Allergies  Current Medications:  Current Outpatient Prescriptions  Medication Sig Dispense Refill  . buPROPion (WELLBUTRIN XL) 150 MG 24 hr tablet Take 150 mg by mouth daily.    Marland Kitchen escitalopram (LEXAPRO) 20 MG tablet Take 20 mg by mouth at bedtime.    . hydrOXYzine (ATARAX/VISTARIL) 25 MG tablet Take 25 mg by mouth 3 (three) times daily as needed.    . OXcarbazepine (TRILEPTAL) 150 MG tablet Take 150 mg by mouth 2 (two) times daily.    . risperiDONE (RISPERDAL) 3 MG tablet Take 3 mg by mouth at bedtime.    . docusate sodium (COLACE) 100 MG capsule Take 1 capsule (100 mg total) by mouth 2 (two) times daily as needed. (Patient not  taking: Reported on 06/12/2015) 30 capsule 2  . ibuprofen (ADVIL,MOTRIN) 800 MG tablet Take 1 tablet (800 mg total) by mouth every 8 (eight) hours as needed for moderate pain. (Patient not taking: Reported on 06/12/2015) 30 tablet 1  . oxyCODONE-acetaminophen (PERCOCET/ROXICET) 5-325 MG per tablet Take 1-2 tablets by mouth every 6 (six) hours as needed. (Patient not taking: Reported on 06/12/2015) 30 tablet 0  . traMADol (ULTRAM) 50 MG tablet Take 50 mg by mouth every 6 (six) hours as needed.    . traZODone (DESYREL) 50 MG tablet Take 50 mg by mouth at bedtime. Reported on 06/12/2015     No current facility-administered medications for this visit.     Review of Systems:  GENERAL:  Feels like "flu for past 6 months".  No fevers or sweats.  Weight loss of 30 pounds in the past 1 year. PERFORMANCE STATUS (ECOG):  0 HEENT:  No visual changes, runny nose, sore throat, mouth sores or tenderness. Lungs: No shortness of breath .  Cough causes emesis.  No hemoptysis.  Possible sleep apnea. Cardiac:  No chest pain, palpitations, orthopnea, or PND. GI:  Stress and eating causes emesis.  No appetite x 2-3 months.  Early satiety.  Diarrhea 2-3 x/day.  No nausea, constipation, melena or hematochezia. GU:  Stress urinary incontinence.  No urgency, frequency, dysuria, or hematuria. Musculoskeletal:  No back pain.  No joint pain.  No muscle tenderness. Extremities:  No pain or swelling. Skin:  No rashes or skin changes. Neuro:  No headache, numbness or weakness, balance or coordination issues. Endocrine:  No diabetes, thyroid issues, hot flashes or night sweats. Psych:  Scared.  Stress.  No depression or anxiety. Pain:  No focal pain. Review of systems:  All other systems reviewed and found to be negative.  Physical Exam: Blood pressure 126/79, pulse (!) 112, temperature (!) 96.9 F (36.1 C), resp. rate 18, weight 218 lb 14.7 oz (99.3 kg). GENERAL:  Well developed, well nourished, woman sitting comfortably  in the exam room in no acute distress. MENTAL STATUS:  Alert and oriented to person, place and time. HEAD:  Dark brown hair.  Full face.  Normocephalic, atraumatic, face symmetric, no Cushingoid features. EYES:  Pupils equal round and reactive to light and accomodation.  No conjunctivitis or scleral icterus. ENT:  Oropharynx clear without lesion.  Tongue normal. Mucous membranes moist.  RESPIRATORY:  Soft wheeze.  Clear to auscultation without rales or rhonchi. CARDIOVASCULAR:  Regular rate and rhythm without murmur, rub or gallop. ABDOMEN:  Soft, tender somewhat full LUQ without guarding or rebound tenderness.  Active bowel sounds and no appreciable hepatomegaly.  No masses. SKIN:  Tattoos.  Piercing nose and umbilicus.  No rashes, ulcers or lesions. EXTREMITIES: No edema, no skin discoloration or tenderness.  No palpable  cords. LYMPH NODES: No palpable cervical, supraclavicular, axillary or inguinal adenopathy  NEUROLOGICAL: Unremarkable. PSYCH:  Appropriate.   No visits with results within 3 Day(s) from this visit.  Latest known visit with results is:  Admission on 11/14/2014, Discharged on 11/14/2014  Component Date Value Ref Range Status  . Preg Test, Ur 11/14/2014 NEGATIVE  NEGATIVE Final   Comment:        THE SENSITIVITY OF THIS METHODOLOGY IS >24 mIU/mL     Assessment:  Latoya Kelley is a 21 y.o. female with erythrocytosis noted in 09/2016.  She smokes 1 pack/day.  She may have sleep apnea.  She is heterozygous for Factor V Leiden.  She has never had a clot.  Symptomatically, she has lost 30 pounds in the past year.  Stress causes emesis.  She has felt like she has had the flu off/on for the past 6 months.  She has early satiety.  She has had after bath itching for 6-8 months.  She has diarrhea 2-3x/day.  She denies any melena or hematochezia.  She has had stress incontinence x 2 months.  Exam reveals a tender full LUQ.  Plan: 1.  Discuss lab results documenting  erythrocytosis.  WBC and platelet count are normal.  Discuss primary versus secondary erythrocytosis.  Patient smokes and may have sleep apnea which is causing an elevated hematocrit.  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  I briefly discussed primary erythrocytosis (PV) and its treatment.  Discuss work-up. 2.  Discuss Factor V Leiden and risk of thrombosis.  Discuss analogy of basket floating on the water.  Rocks in the basket are risk factors for thrombosis.  Discuss eliminating rocks patient can control.  Discuss smoking cessation. 3.  Labs today:  CBC with diff, epo level, JAK2 with reflex, BCR-ABL, ferritin, carbon monoxide level. 4.  Abdominal ultrasound- assess liver and spleen. 5.  Suggest sleep apnea testing. 6.  RTC in 1-2 weeks for review of labs and abdominal ultrasound.   Lequita Asal, MD  10/26/2016, 12:21 PM

## 2016-10-26 NOTE — Progress Notes (Addendum)
1 

## 2016-10-26 NOTE — Progress Notes (Signed)
Patient here today as new evaluation regarding polycythemia.  Referred by Eulogio Bear.

## 2016-10-27 ENCOUNTER — Encounter: Payer: Self-pay | Admitting: Obstetrics and Gynecology

## 2016-10-27 LAB — CARBON MONOXIDE, BLOOD (PERFORMED AT REF LAB): Carbon Monoxide, Blood: 8.3 % — ABNORMAL HIGH (ref 0.0–3.6)

## 2016-10-27 LAB — ERYTHROPOIETIN: Erythropoietin: 8.6 m[IU]/mL (ref 2.6–18.5)

## 2016-10-28 ENCOUNTER — Inpatient Hospital Stay: Payer: Managed Care, Other (non HMO)

## 2016-10-30 ENCOUNTER — Encounter: Payer: Self-pay | Admitting: Hematology and Oncology

## 2016-10-30 DIAGNOSIS — D6851 Activated protein C resistance: Secondary | ICD-10-CM | POA: Insufficient documentation

## 2016-11-01 ENCOUNTER — Ambulatory Visit: Payer: Managed Care, Other (non HMO)

## 2016-11-04 LAB — BCR-ABL1 FISH
Cells Analyzed: 200
Cells Counted: 200

## 2016-11-07 ENCOUNTER — Other Ambulatory Visit: Payer: Self-pay | Admitting: *Deleted

## 2016-11-07 DIAGNOSIS — D751 Secondary polycythemia: Secondary | ICD-10-CM

## 2016-11-07 LAB — JAK2  V617F QUAL. WITH REFLEX TO EXON 12: Reflex:: 15

## 2016-11-09 ENCOUNTER — Inpatient Hospital Stay: Payer: Managed Care, Other (non HMO) | Admitting: Hematology and Oncology

## 2016-11-09 ENCOUNTER — Inpatient Hospital Stay: Payer: Managed Care, Other (non HMO)

## 2016-11-09 NOTE — Progress Notes (Deleted)
[facility] day:  11/09/2016  Chief Complaint: Latoya Kelley is a 21 y.o. female with polycythemia who is seen for review of workup and discussion regarding direction of therapy.  HPI:  The patient was last seen in the hematology office on 10/26/2016.  At that time, she was seen for initial consultation.  She had erythrocytosis noted in 09/2016.  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  She may have sleep apnea.  She underwent a work-up.  CMP was normal.  Ferritin was 21.  Erythropoietin level was 8.6.  JAK2 testing was unsuccessful.  BCR-ABL was negative. Carbon monoxide level was 8.3 (< 9.9 smokers).  Abdominal ultrasound was not performed.   Past Medical History:  Diagnosis Date  . Anxiety   . Depression   . Factor 5 Leiden mutation, heterozygous ([place])   . GERD (gastroesophageal reflux disease)    history by endo  . Panic attack     Past Surgical History:  Procedure Laterality Date  . INTRAUTERINE DEVICE (IUD) INSERTION N/A 11/14/2014   Procedure: INTRAUTERINE DEVICE (IUD) INSERTION;  Surgeon: Benjaman Kindler, MD;  Location: [facility] ORS;  Service: Gynecology;  Laterality: N/A;  . IUD REMOVAL N/A 11/14/2014   Procedure: INTRAUTERINE DEVICE (IUD) REMOVAL;  Surgeon: Benjaman Kindler, MD;  Location: [facility] ORS;  Service: Gynecology;  Laterality: N/A;  . LAPAROSCOPY N/A 11/14/2014   Procedure: LAPAROSCOPY DIAGNOSTIC;  Surgeon: Benjaman Kindler, MD;  Location: [facility] ORS;  Service: Gynecology;  Laterality: N/A;  . UPPER GI ENDOSCOPY    . WISDOM TOOTH EXTRACTION      Family History  Problem Relation Age of Onset  . Hypertension Mother   . Factor V Leiden deficiency Mother   . Factor V Leiden deficiency Sister   . Factor V Leiden deficiency Maternal Grandmother   . Skin cancer Maternal Grandmother   . Lymphoma Paternal Grandfather   . Factor V Leiden deficiency Sister     Social History:  reports that  she has been smoking.  She has never used smokeless tobacco. She reports that she does not drink alcohol or use drugs.  She has been smoking 1 pack/day x 2 years.  She drinks alcohol socially 1/month.  She denies any drug use.  She has not had sex in 1 year.  She denies any exposure to radiation or toxins.  She lives independently in [place].  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  The patient is accompanied by her mother and grandmother today.  Allergies: No Known Allergies  Current Medications: Current Outpatient Prescriptions  Medication Sig Dispense Refill  . buPROPion (WELLBUTRIN XL) 150 MG 24 hr tablet Take 150 mg by mouth daily.    Marland Kitchen docusate sodium (COLACE) 100 MG capsule Take 1 capsule (100 mg total) by mouth 2 (two) times daily as needed. (Patient not taking: Reported on 06/12/2015) 30 capsule 2  . escitalopram (LEXAPRO) 20 MG tablet Take 20 mg by mouth at bedtime.    . hydrOXYzine (ATARAX/VISTARIL) 25 MG tablet Take 25 mg by mouth 3 (three) times daily as needed.    Marland Kitchen ibuprofen (ADVIL,MOTRIN) 800 MG tablet Take 1 tablet (800 mg total) by mouth every 8 (eight) hours as needed for moderate pain. (Patient not taking: Reported on 06/12/2015) 30 tablet 1  . OXcarbazepine (TRILEPTAL) 150 MG tablet Take 150 mg by mouth 2 (two) times daily.    Marland Kitchen oxyCODONE-acetaminophen (PERCOCET/ROXICET)  5-325 MG per tablet Take 1-2 tablets by mouth every 6 (six) hours as needed. (Patient not taking: Reported on 06/12/2015) 30 tablet 0  . risperiDONE (RISPERDAL) 3 MG tablet Take 3 mg by mouth at bedtime.    . traMADol (ULTRAM) 50 MG tablet Take 50 mg by mouth every 6 (six) hours as needed.    . traZODone (DESYREL) 50 MG tablet Take 50 mg by mouth at bedtime. Reported on 06/12/2015     No current facility-administered medications for this visit.     Review of Systems:  GENERAL:  Feels like "flu for past 6 months".  No fevers or sweats.  Weight loss of 30 pounds in the past  1 year. PERFORMANCE STATUS (ECOG):  0 HEENT:  No visual changes, runny nose, sore throat, mouth sores or tenderness. Lungs: No shortness of breath .  Cough causes emesis.  No hemoptysis.  Possible sleep apnea. Cardiac:  No chest pain, palpitations, orthopnea, or PND. GI:  Stress and eating causes emesis.  No appetite x 2-3 months.  Early satiety.  Diarrhea 2-3 x/day.  No nausea, constipation, melena or hematochezia. GU:  Stress urinary incontinence.  No urgency, frequency, dysuria, or hematuria. Musculoskeletal:  No back pain.  No joint pain.  No muscle tenderness. Extremities:  No pain or swelling. Skin:  No rashes or skin changes. Neuro:  No headache, numbness or weakness, balance or coordination issues. Endocrine:  No diabetes, thyroid issues, hot flashes or night sweats. Psych:  Scared.  Stress.  No depression or anxiety. Pain:  No focal pain. Review of systems:  All other systems reviewed and found to be negative.  Physical Exam: There were no vitals taken for this visit. GENERAL:  Well developed, well nourished, woman sitting comfortably in the exam room in no acute distress. MENTAL STATUS:  Alert and oriented to person, place and time. HEAD:  Dark brown hair.  Full face.  Normocephalic, atraumatic, face symmetric, no Cushingoid features. EYES:  Pupils equal round and reactive to light and accomodation.  No conjunctivitis or scleral icterus. ENT:  Oropharynx clear without lesion.  Tongue normal. Mucous membranes moist.  RESPIRATORY:  Soft wheeze.  Clear to auscultation without rales or rhonchi. CARDIOVASCULAR:  Regular rate and rhythm without murmur, rub or gallop. ABDOMEN:  Soft, tender somewhat full LUQ without guarding or rebound tenderness.  Active bowel sounds and no appreciable hepatomegaly.  No masses. SKIN:  Tattoos.  Piercing nose and umbilicus.  No rashes, ulcers or lesions. EXTREMITIES: No edema, no skin discoloration or tenderness.  No palpable cords. LYMPH NODES: No  palpable cervical, supraclavicular, axillary or inguinal adenopathy  NEUROLOGICAL: Unremarkable. PSYCH:  Appropriate.   No visits with results within 3 Day(s) from this visit.  Latest known visit with results is:  Office Visit on 10/26/2016  Component Date Value Ref Range Status  . Erythropoietin 10/26/2016 8.6  2.6 - 18.5 mIU/mL Final   Comment: (NOTE) [employer] UniCel DxI 800 Immunoassay System Performed At: [facility] [place], [place] [id number] Lindon Romp MD YY:[id number]   . JAK2 GenotypR 10/26/2016 UOR   Final   Comment: (NOTE) Repeated efforts to analyze this specimen have been unsuccessful. Technical problems occur with some samples and may be due to one of the following: insufficient specimen, incorrect anticoagulant, or DNA/RNA degradation in the sample. Please resubmit a new sample for re-evaluation.   Marland Kitchen BACKGROUND: 10/26/2016 Comment   Final   Comment: (NOTE) JAK2 is a cytoplasmic tyrosine kinase with a  key role in signal transduction from multiple hematopoietic growth factor receptors. A point mutation within exon 14 of the JAK2 gene ([id number]) encoding a valine to phenylalanine substitution at position 617 of the JAK2 protein (V617F) has been identified in most patients with polycythemia vera, and in about half of those with either essential thrombocythemia or idiopathic myelofibrosis. The V617F has also been detected, although infrequently, in other myeloid disorders such as chronic myelomonocytic leukemia and chronic neutrophilic luekemia. V617F is an acquired mutation that alters a highly conserved valine present in the negative regulatory JH2 domain of the JAK2 protein and is predicted to dysregulate kinase activity. Methodology: Total genomic DNA was extracted and subjected to TaqMan real-time PCR amplification/detection. Two amplification products per sample were monitored by real-time PCR using primers/probes s                           pecific to JAK2 wild type (WT) and JAK2 mutant V617F. The ABI7900 Absolute Quantitation software will compare the patient specimen valuse to the standard curves and generate percent values for wild type and mutant type. In vitro studies have indicated that this assay has an analytical sensitivity of 1%. References: Baxter EJ, Scott Phineas Real, et al. Acquired mutation of the tyrosine kinase JAK2 in human myeloproliferative disorders. Lancet. 2005 Mar 19-25; 365(9464):1054-1061. Alfonso Ramus Couedic JP. A unique clonal JAK2 mutation leading to constitutive signaling causes polycythaemia vera. Nature. 2005 Apr 28; 434(7037):1144-1148. Kralovics R, Passamonti F, Buser AS, et al. A gain-of-function mutation of JAK2 in myeloproliferative disorders. N Engl J Med. 2005 Apr 28; 352(17):1779-1790.   . Director Review, JAK2 10/26/2016 Comment   Final   Comment: (NOTE) Katina Degree, MD, PhD Director, [facility] [place], [place] [address] [phone]   . Reflex: 10/26/2016 15 Mutation Analysis is not indicated.   Corrected   Comment: Comment Reflex to JAK2 Exon 12   . Extraction 10/26/2016 Completed   Corrected   Comment: (NOTE) Performed At: [facility] [address] [place], [place] [id number] Nechama Guard MD KG:[id number] Performed At: [facility] [place], [place] [id number] Nechama Guard MD FO:[id number]   . Specimen Type 10/26/2016 BLOOD   Final  . Cells Counted 10/26/2016 200   Final  . Cells Analyzed 10/26/2016 200   Final  . FISH Result 10/26/2016 Comment:   Final   NORMAL:  NO BCR OR ABL GENE REARRANGEMENT OBSERVED  . Interpretation 10/26/2016 Comment:   Final   Comment: (NOTE)             nuc ish 9q34(ASS1,ABL1)x2,22q11.2(BCRx2)[200].      The fluorescence in situ hybridization (FISH) study was normal. FISH, using unique sequence DNA  probes for the ABL1 and BCR gene regions showed two ABL1 signals (red), two control ASS1 gene signals (aqua) located adjacent to the ABL1 locus at 9q34, and two BCR signals (green) at 22q11.2 in all interphase nuclei examined. There was NO evidence of CML or ALL-associated BCR/ABL1 dual fusion signals in this analysis. .      This analysis is limited to abnormalities detectable by the specific probes included in the study. FISH results should be interpreted within the context of a full cytogenetic analysis and pathology evaluation. Marland Kitchen  .This test was developed and its performance characteristics determined by [employer]). It has not been cleared or approved by the  U.S. Food and Drug Administration. The DNA probe vendor for this study was [employer] research (physical sciences))                          .   . Director Review: 10/26/2016 Comment:   Final   Comment: (NOTE) Leonette Monarch, PhD, [facility] Performed At: [facility] [address] [place], [place] [id number] Nechama Guard MD TM:[id number]   . Sodium 10/26/2016 135  135 - 145 mmol/L Final  . Potassium 10/26/2016 3.7  3.5 - 5.1 mmol/L Final  . Chloride 10/26/2016 104  101 - 111 mmol/L Final  . CO2 10/26/2016 22  22 - 32 mmol/L Final  . Glucose, Bld 10/26/2016 117* 65 - 99 mg/dL Final  . BUN 10/26/2016 5* 6 - 20 mg/dL Final  . Creatinine, Ser 10/26/2016 0.86  0.44 - 1.00 mg/dL Final  . Calcium 10/26/2016 9.0  8.9 - 10.3 mg/dL Final  . Total Protein 10/26/2016 7.0  6.5 - 8.1 g/dL Final  . Albumin 10/26/2016 4.0  3.5 - 5.0 g/dL Final  . AST 10/26/2016 23  15 - 41 U/L Final  . ALT 10/26/2016 15  14 - 54 U/L Final  . Alkaline Phosphatase 10/26/2016 48  38 - 126 U/L Final  . Total Bilirubin 10/26/2016 0.4  0.3 - 1.2 mg/dL Final  . GFR calc non Af Amer 10/26/2016 >60  >60 mL/min Final  . GFR calc Af Amer 10/26/2016 >60  >60 mL/min Final   Comment: (NOTE) The eGFR has been calculated  using the CKD EPI equation. This calculation has not been validated in all clinical situations. eGFR's persistently <60 mL/min signify possible Chronic Kidney Disease.   . Anion gap 10/26/2016 9  5 - 15 Final  . Ferritin 10/26/2016 21  11 - 307 ng/mL Final  . Carbon Monoxide, Blood 10/26/2016 8.3* 0.0 - 3.6 % Final   Comment: (NOTE)                            Environmental Exposure:                             Nonsmokers           <3.7                             Smokers              <9.9                            Occupational Exposure:                             BEI                   3.5                                Detection Limit =  0.2 Performed At: [facility] [place], [place] [id number] Lindon Romp MD SK:[id number]     Assessment:  Latoya Kelley is a 21 y.o. female with erythrocytosis noted in 09/2016.  She smokes 1 pack/day.  She may have sleep apnea.  Work-up on 10/26/2016 revealed the following normal labs:  CMP, ferritin, erythropoietin level, and BCR-ABL.  JAK2 testing was unsuccessful.  Carbon monoxide level was 8.3 (< 9.9 smokers).  She is heterozygous for Factor V Leiden.  She has never had a clot.  Symptomatically, she has lost 30 pounds in the past year.  Stress causes emesis.  She has felt like she has had the flu off/on for the past 6 months.  She has early satiety.  She has had after bath itching for 6-8 months.  She has diarrhea 2-3x/day.  She denies any melena or hematochezia.  She has had stress incontinence x 2 months.  Exam reveals a tender full LUQ.  Plan: 1.  Discuss partial work-up.  Results negative to date except for elevated carbon monoxide level secondary to smoking.  Redraw CBC and JAK2 today. 2.  Reschedule abdominal ultrasound- assess liver and spleen. 3.  Phone follow-up with results. 4.  Recommend sleep apnea testing. 5.  RTC prn.   Lequita Asal, MD  11/09/2016, 2:17 AM

## 2016-11-24 ENCOUNTER — Other Ambulatory Visit: Payer: Self-pay | Admitting: *Deleted

## 2016-11-24 ENCOUNTER — Inpatient Hospital Stay: Payer: Managed Care, Other (non HMO) | Attending: Hematology and Oncology

## 2016-11-24 DIAGNOSIS — Z808 Family history of malignant neoplasm of other organs or systems: Secondary | ICD-10-CM | POA: Diagnosis not present

## 2016-11-24 DIAGNOSIS — F17210 Nicotine dependence, cigarettes, uncomplicated: Secondary | ICD-10-CM | POA: Diagnosis not present

## 2016-11-24 DIAGNOSIS — K219 Gastro-esophageal reflux disease without esophagitis: Secondary | ICD-10-CM | POA: Insufficient documentation

## 2016-11-24 DIAGNOSIS — F410 Panic disorder [episodic paroxysmal anxiety] without agoraphobia: Secondary | ICD-10-CM | POA: Insufficient documentation

## 2016-11-24 DIAGNOSIS — R6881 Early satiety: Secondary | ICD-10-CM | POA: Insufficient documentation

## 2016-11-24 DIAGNOSIS — R197 Diarrhea, unspecified: Secondary | ICD-10-CM | POA: Insufficient documentation

## 2016-11-24 DIAGNOSIS — D751 Secondary polycythemia: Secondary | ICD-10-CM | POA: Diagnosis present

## 2016-11-24 DIAGNOSIS — N393 Stress incontinence (female) (male): Secondary | ICD-10-CM | POA: Diagnosis not present

## 2016-11-24 DIAGNOSIS — D6851 Activated protein C resistance: Secondary | ICD-10-CM | POA: Insufficient documentation

## 2016-11-24 DIAGNOSIS — R634 Abnormal weight loss: Secondary | ICD-10-CM | POA: Insufficient documentation

## 2016-11-24 DIAGNOSIS — F439 Reaction to severe stress, unspecified: Secondary | ICD-10-CM | POA: Insufficient documentation

## 2016-11-24 DIAGNOSIS — R1110 Vomiting, unspecified: Secondary | ICD-10-CM | POA: Insufficient documentation

## 2016-11-24 DIAGNOSIS — Z79899 Other long term (current) drug therapy: Secondary | ICD-10-CM | POA: Diagnosis not present

## 2016-11-24 LAB — CBC WITH DIFFERENTIAL/PLATELET
Basophils Absolute: 0.1 10*3/uL (ref 0–0.1)
Basophils Relative: 1 %
Eosinophils Absolute: 0.2 10*3/uL (ref 0–0.7)
Eosinophils Relative: 3 %
HCT: 45.7 % (ref 35.0–47.0)
Hemoglobin: 15.7 g/dL (ref 12.0–16.0)
Lymphocytes Relative: 35 %
Lymphs Abs: 2.5 10*3/uL (ref 1.0–3.6)
MCH: 31.6 pg (ref 26.0–34.0)
MCHC: 34.3 g/dL (ref 32.0–36.0)
MCV: 92.2 fL (ref 80.0–100.0)
Monocytes Absolute: 0.6 10*3/uL (ref 0.2–0.9)
Monocytes Relative: 8 %
Neutro Abs: 3.9 10*3/uL (ref 1.4–6.5)
Neutrophils Relative %: 53 %
Platelets: 280 10*3/uL (ref 150–440)
RBC: 4.96 MIL/uL (ref 3.80–5.20)
RDW: 12.8 % (ref 11.5–14.5)
WBC: 7.3 10*3/uL (ref 3.6–11.0)

## 2016-11-29 ENCOUNTER — Ambulatory Visit
Admission: RE | Admit: 2016-11-29 | Discharge: 2016-11-29 | Disposition: A | Discharge status: Home / Self Care | Payer: Managed Care, Other (non HMO) | Source: Ambulatory Visit | Attending: Hematology and Oncology | Admitting: Hematology and Oncology

## 2016-11-29 DIAGNOSIS — R6881 Early satiety: Secondary | ICD-10-CM | POA: Insufficient documentation

## 2016-11-29 DIAGNOSIS — D751 Secondary polycythemia: Secondary | ICD-10-CM | POA: Diagnosis present

## 2016-11-30 ENCOUNTER — Encounter: Payer: Self-pay | Admitting: Hematology and Oncology

## 2016-11-30 ENCOUNTER — Inpatient Hospital Stay (HOSPITAL_BASED_OUTPATIENT_CLINIC_OR_DEPARTMENT_OTHER): Payer: Managed Care, Other (non HMO) | Admitting: Hematology and Oncology

## 2016-11-30 VITALS — BP 122/81 | HR 73 | Temp 96.3°F | Resp 18 | Wt 218.5 lb

## 2016-11-30 DIAGNOSIS — D6851 Activated protein C resistance: Secondary | ICD-10-CM | POA: Diagnosis not present

## 2016-11-30 DIAGNOSIS — R634 Abnormal weight loss: Secondary | ICD-10-CM | POA: Diagnosis not present

## 2016-11-30 DIAGNOSIS — K219 Gastro-esophageal reflux disease without esophagitis: Secondary | ICD-10-CM

## 2016-11-30 DIAGNOSIS — R1110 Vomiting, unspecified: Secondary | ICD-10-CM | POA: Diagnosis not present

## 2016-11-30 DIAGNOSIS — F17210 Nicotine dependence, cigarettes, uncomplicated: Secondary | ICD-10-CM | POA: Diagnosis not present

## 2016-11-30 DIAGNOSIS — R197 Diarrhea, unspecified: Secondary | ICD-10-CM

## 2016-11-30 DIAGNOSIS — F410 Panic disorder [episodic paroxysmal anxiety] without agoraphobia: Secondary | ICD-10-CM | POA: Diagnosis not present

## 2016-11-30 DIAGNOSIS — R6881 Early satiety: Secondary | ICD-10-CM | POA: Diagnosis not present

## 2016-11-30 DIAGNOSIS — D751 Secondary polycythemia: Secondary | ICD-10-CM

## 2016-11-30 DIAGNOSIS — Z79899 Other long term (current) drug therapy: Secondary | ICD-10-CM | POA: Diagnosis not present

## 2016-11-30 DIAGNOSIS — F439 Reaction to severe stress, unspecified: Secondary | ICD-10-CM

## 2016-11-30 DIAGNOSIS — N393 Stress incontinence (female) (male): Secondary | ICD-10-CM

## 2016-11-30 DIAGNOSIS — Z808 Family history of malignant neoplasm of other organs or systems: Secondary | ICD-10-CM

## 2016-11-30 LAB — JAK2 EXONS 12-15

## 2016-11-30 NOTE — Progress Notes (Signed)
[facility] day:  11/30/2016  Chief Complaint: Latoya Kelley is a 21 y.o. female with polycythemia who is seen for review of workup and discussion regarding direction of therapy.  HPI:  The patient was last seen in the hematology office on 10/26/2016.  At that time, she was seen for initial consultation.  She had erythrocytosis noted in 09/2016.  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  She may have sleep apnea.  She underwent a work-up.  CMP was normal.  Ferritin was 21.  Erythropoietin level was 8.6.  JAK2 testing was unsuccessful.  BCR-ABL was negative. Carbon monoxide level was 8.3 (< 9.9 smokers).  CBC on 11/24/2016 revealed a hematocrit of 45.7, hemoglobin 15.7, weight is 280,000, white count 7300 with an ANC of 3900. Differential was unremarkable.  JAK2 is pending.  Abdominal ultrasound on 11/29/2016 revealed no splenomegaly.  Symptomatically, she denies any complaints.  She is smoking 1/2 pack per day.  She recently started a new job ([employer]).   Past Medical History:  Diagnosis Date  . Anxiety   . Depression   . Factor 5 Leiden mutation, heterozygous ([place])   . GERD (gastroesophageal reflux disease)    history by endo  . Panic attack     Past Surgical History:  Procedure Laterality Date  . INTRAUTERINE DEVICE (IUD) INSERTION N/A 11/14/2014   Procedure: INTRAUTERINE DEVICE (IUD) INSERTION;  Surgeon: Benjaman Kindler, MD;  Location: [facility] ORS;  Service: Gynecology;  Laterality: N/A;  . IUD REMOVAL N/A 11/14/2014   Procedure: INTRAUTERINE DEVICE (IUD) REMOVAL;  Surgeon: Benjaman Kindler, MD;  Location: [facility] ORS;  Service: Gynecology;  Laterality: N/A;  . LAPAROSCOPY N/A 11/14/2014   Procedure: LAPAROSCOPY DIAGNOSTIC;  Surgeon: Benjaman Kindler, MD;  Location: [facility] ORS;  Service: Gynecology;  Laterality: N/A;  . UPPER GI ENDOSCOPY    . WISDOM TOOTH EXTRACTION      Family History  Problem  Relation Age of Onset  . Hypertension Mother   . Factor V Leiden deficiency Mother   . Factor V Leiden deficiency Sister   . Factor V Leiden deficiency Maternal Grandmother   . Skin cancer Maternal Grandmother   . Lymphoma Paternal Grandfather   . Factor V Leiden deficiency Sister     Social History:  reports that she has been smoking.  She has never used smokeless tobacco. She reports that she does not drink alcohol or use drugs.  She has been smoking 1 pack/day x 2 years.  She drinks alcohol socially 1/month.  She denies any drug use.  She has not had sex in 1 year.  She denies any exposure to radiation or toxins.  She lives independently in [place].  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  The patient is accompanied by her grandmother today.  Allergies: No Known Allergies  Current Medications: Current Outpatient Prescriptions  Medication Sig Dispense Refill  . buPROPion (WELLBUTRIN XL) 150 MG 24 hr tablet Take 150 mg by mouth daily.    Marland Kitchen escitalopram (LEXAPRO) 20 MG tablet Take 20 mg by mouth at bedtime.    . hydrOXYzine (ATARAX/VISTARIL) 25 MG tablet Take 25 mg by mouth 3 (three) times daily as needed.    . OXcarbazepine (TRILEPTAL) 150 MG tablet Take 150 mg by mouth 2 (two) times daily.    . risperiDONE (RISPERDAL) 3 MG tablet Take 3  mg by mouth at bedtime.    . traMADol (ULTRAM) 50 MG tablet Take 50 mg by mouth every 6 (six) hours as needed.    . traZODone (DESYREL) 50 MG tablet Take 50 mg by mouth at bedtime. Reported on 06/12/2015    . docusate sodium (COLACE) 100 MG capsule Take 1 capsule (100 mg total) by mouth 2 (two) times daily as needed. (Patient not taking: Reported on 06/12/2015) 30 capsule 2  . ibuprofen (ADVIL,MOTRIN) 800 MG tablet Take 1 tablet (800 mg total) by mouth every 8 (eight) hours as needed for moderate pain. (Patient not taking: Reported on 06/12/2015) 30 tablet 1  . oxyCODONE-acetaminophen  (PERCOCET/ROXICET) 5-325 MG per tablet Take 1-2 tablets by mouth every 6 (six) hours as needed. (Patient not taking: Reported on 06/12/2015) 30 tablet 0   No current facility-administered medications for this visit.     Review of Systems:  GENERAL:  Feels like "fine".  Less "complaining".  No fevers or sweats.  Weight loss of 30 pounds in the past 1 year.  Weight stable since last visit. PERFORMANCE STATUS (ECOG):  0 HEENT:  No visual changes, runny nose, sore throat, mouth sores or tenderness. Lungs: No shortness of breath .  Cough causes emesis.  No hemoptysis.  Possible sleep apnea. Cardiac:  No chest pain, palpitations, orthopnea, or PND. GI:  Stress and eating causes emesis.  Appetite 25%.  7 bowel movements/week.  No nausea, diarrhea, constipation, melena or hematochezia. GU:  Stress urinary incontinence.  No urgency, frequency, dysuria, or hematuria. Musculoskeletal:  No back pain.  No joint pain.  No muscle tenderness. Extremities:  No pain or swelling. Skin:  No rashes or skin changes. Neuro:  No headache, numbness or weakness, balance or coordination issues. Endocrine:  No diabetes, thyroid issues, hot flashes or night sweats. Psych:  Scared.  Stress.  No depression or anxiety. Pain:  No focal pain. Review of systems:  All other systems reviewed and found to be negative.  Physical Exam: Blood pressure 122/81, pulse 73, temperature (!) 96.3 F (35.7 C), temperature source Tympanic, resp. rate 18, weight 218 lb 7.6 oz (99.1 kg). GENERAL:  Well developed, well nourished, woman sitting comfortably in the exam room in no acute distress. MENTAL STATUS:  Alert and oriented to person, place and time. HEAD:  Dark brown hair pulled up.  Full face.  Normocephalic, atraumatic, face symmetric, no Cushingoid features. EYES:  No conjunctivitis or scleral icterus. SKIN:  Tattoos.  Piercing nose and umbilicus.  No rashes, ulcers or lesions. NEUROLOGICAL: Unremarkable. PSYCH:   Appropriate.   No visits with results within 3 Day(s) from this visit.  Latest known visit with results is:  Orders Only on 11/24/2016  Component Date Value Ref Range Status  . WBC 11/24/2016 7.3  3.6 - 11.0 K/uL Final  . RBC 11/24/2016 4.96  3.80 - 5.20 MIL/uL Final  . Hemoglobin 11/24/2016 15.7  12.0 - 16.0 g/dL Final  . HCT 11/24/2016 45.7  35.0 - 47.0 % Final  . MCV 11/24/2016 92.2  80.0 - 100.0 fL Final  . MCH 11/24/2016 31.6  26.0 - 34.0 pg Final  . MCHC 11/24/2016 34.3  32.0 - 36.0 g/dL Final  . RDW 11/24/2016 12.8  11.5 - 14.5 % Final  . Platelets 11/24/2016 280  150 - 440 K/uL Final  . Neutrophils Relative % 11/24/2016 53  % Final  . Neutro Abs 11/24/2016 3.9  1.4 - 6.5 K/uL Final  . Lymphocytes Relative 11/24/2016 35  %  Final  . Lymphs Abs 11/24/2016 2.5  1.0 - 3.6 K/uL Final  . Monocytes Relative 11/24/2016 8  % Final  . Monocytes Absolute 11/24/2016 0.6  0.2 - 0.9 K/uL Final  . Eosinophils Relative 11/24/2016 3  % Final  . Eosinophils Absolute 11/24/2016 0.2  0 - 0.7 K/uL Final  . Basophils Relative 11/24/2016 1  % Final  . Basophils Absolute 11/24/2016 0.1  0 - 0.1 K/uL Final    Assessment:  Latoya Kelley is a 21 y.o. female with erythrocytosis noted in 09/2016.  She smokes 1 pack/day.  She may have sleep apnea.  Work-up on 10/26/2016 revealed the following normal labs:  CMP, ferritin, erythropoietin level, and BCR-ABL.  JAK2 testing was unsuccessful.  Carbon monoxide level was 8.3 (< 9.9 smokers).  She is heterozygous for Factor V Leiden.  She has never had a clot.  Abdominal ultrasound on 11/29/2016 revealed no splenomegaly.  Symptomatically, she has lost 30 pounds in the past year.  Stress causes emesis.  She has felt like she has had the flu off/on for the past 6 months.  She has early satiety.  She has had after bath itching for 6-8 months.  She has diarrhea 2-3x/day.  She denies any melena or hematochezia.  She has had stress incontinence x 2 months.  Exam  reveals a tender full LUQ.  Plan: 1.  Discuss work-up to date.  Recent hematocrit was normal.  Results negative to date except for elevated carbon monoxide level secondary to smoking.  Ultrasound normal.  Await JAK2 (doubt positive). 2.  Recommend sleep apnea testing. 3.  RTC prn.   Lequita Asal, MD  11/30/2016, 9:27 AM

## 2016-11-30 NOTE — Progress Notes (Signed)
Patient states she does not have much of an appetite at all.  She does not use any supplements.  Patient here for results of labs and abdominal US.

## 2016-12-02 ENCOUNTER — Other Ambulatory Visit: Payer: Self-pay | Admitting: *Deleted

## 2016-12-21 ENCOUNTER — Other Ambulatory Visit: Payer: Self-pay | Admitting: *Deleted

## 2016-12-21 DIAGNOSIS — D6851 Activated protein C resistance: Secondary | ICD-10-CM

## 2016-12-22 ENCOUNTER — Inpatient Hospital Stay: Payer: Managed Care, Other (non HMO) | Attending: Hematology and Oncology

## 2016-12-22 DIAGNOSIS — R634 Abnormal weight loss: Secondary | ICD-10-CM | POA: Insufficient documentation

## 2016-12-22 DIAGNOSIS — Z79899 Other long term (current) drug therapy: Secondary | ICD-10-CM | POA: Diagnosis not present

## 2016-12-22 DIAGNOSIS — D751 Secondary polycythemia: Secondary | ICD-10-CM | POA: Insufficient documentation

## 2016-12-22 DIAGNOSIS — R1110 Vomiting, unspecified: Secondary | ICD-10-CM | POA: Diagnosis not present

## 2016-12-22 DIAGNOSIS — Z808 Family history of malignant neoplasm of other organs or systems: Secondary | ICD-10-CM | POA: Diagnosis not present

## 2016-12-22 DIAGNOSIS — R6881 Early satiety: Secondary | ICD-10-CM | POA: Insufficient documentation

## 2016-12-22 DIAGNOSIS — D6851 Activated protein C resistance: Secondary | ICD-10-CM | POA: Diagnosis not present

## 2016-12-22 DIAGNOSIS — N393 Stress incontinence (female) (male): Secondary | ICD-10-CM | POA: Diagnosis not present

## 2016-12-22 DIAGNOSIS — F410 Panic disorder [episodic paroxysmal anxiety] without agoraphobia: Secondary | ICD-10-CM | POA: Diagnosis not present

## 2016-12-22 DIAGNOSIS — K219 Gastro-esophageal reflux disease without esophagitis: Secondary | ICD-10-CM | POA: Insufficient documentation

## 2016-12-22 DIAGNOSIS — R197 Diarrhea, unspecified: Secondary | ICD-10-CM | POA: Insufficient documentation

## 2016-12-22 DIAGNOSIS — F439 Reaction to severe stress, unspecified: Secondary | ICD-10-CM | POA: Diagnosis not present

## 2016-12-22 DIAGNOSIS — F17210 Nicotine dependence, cigarettes, uncomplicated: Secondary | ICD-10-CM | POA: Insufficient documentation

## 2017-01-05 LAB — JAK2 EXONS 12-15

## 2017-01-05 LAB — JAK2  V617F QUAL. WITH REFLEX TO EXON 12: Reflex:: 15

## 2017-01-11 ENCOUNTER — Telehealth: Payer: Self-pay | Admitting: *Deleted

## 2017-01-11 NOTE — Telephone Encounter (Signed)
Called patient and informed her that her labs were all wnl and that dr. Mike Gip recommended that she f/u with her PCP for sleep apnea testing, voiced understanding and reported that she had not done this at this time but would get in touch with PCP.

## 2017-08-31 ENCOUNTER — Ambulatory Visit
Admission: EM | Admit: 2017-08-31 | Discharge: 2017-08-31 | Disposition: A | Discharge status: Home / Self Care | Payer: Managed Care, Other (non HMO) | Attending: Family Medicine | Admitting: Family Medicine

## 2017-08-31 DIAGNOSIS — L03012 Cellulitis of left finger: Secondary | ICD-10-CM | POA: Diagnosis not present

## 2017-08-31 MED ORDER — MUPIROCIN 2 % EX OINT: 1.0000 "application " | TOPICAL_OINTMENT | Freq: Three times a day (TID) | CUTANEOUS | 0 refills | Status: DC

## 2017-08-31 MED ORDER — DOXYCYCLINE HYCLATE 100 MG PO CAPS: 100.0000 mg | ORAL_CAPSULE | Freq: Two times a day (BID) | ORAL | 0 refills | Status: DC

## 2017-08-31 NOTE — ED Provider Notes (Addendum)
[facility]    CSN: [id number] Arrival date & time: 08/31/17  1857     History   Chief Complaint Chief Complaint  Patient presents with  . Finger Injury    HPI Latoya Kelley is a 22 y.o. female.   HPI  22 year old female presents with a paronychia of her left middle finger for 1 week.  Had this recurrently in the past.  She bites her fingernails as closely as possible to the quick.  Been soaking her finger in Epsom salts but continues to have pain.  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          Past Medical History:  Diagnosis Date  . Anxiety   . Depression   . Factor 5 Leiden mutation, heterozygous ([place])   . GERD (gastroesophageal reflux disease)    history by endo  . Panic attack     Patient Active Problem List   Diagnosis Date Noted  . 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 Erythrocytosis 10/26/2016    Past Surgical History:  Procedure Laterality Date  . INTRAUTERINE DEVICE (IUD) INSERTION N/A 11/14/2014   Procedure: INTRAUTERINE DEVICE (IUD) INSERTION;  Surgeon: Benjaman Kindler, MD;  Location: [facility];  Service: Gynecology;  Laterality: N/A;  . IUD REMOVAL N/A 11/14/2014   Procedure: INTRAUTERINE DEVICE (IUD) REMOVAL;  Surgeon: Benjaman Kindler, MD;  Location: [facility];  Service: Gynecology;  Laterality: N/A;  . LAPAROSCOPY N/A 11/14/2014   Procedure: LAPAROSCOPY DIAGNOSTIC;  Surgeon: Benjaman Kindler, MD;  Location: [facility];  Service: Gynecology;  Laterality: N/A;  . UPPER GI ENDOSCOPY    . WISDOM TOOTH EXTRACTION      OB History   None      Home Medications    Prior to Admission medications   Medication Sig Start Date End Date Taking? Authorizing Provider  escitalopram (LEXAPRO) 20 MG tablet Take 20 mg by mouth at bedtime.   Yes [provider]  levonorgestrel (MIRENA) 20 MCG/24HR IUD 1 each by Intrauterine route once.   Yes [provider]  doxycycline (VIBRAMYCIN) 100 MG  capsule Take 1 capsule (100 mg total) by mouth 2 (two) times daily. 08/31/17   Lorin Picket, PA-C  mupirocin ointment (BACTROBAN) 2 % Apply 1 application topically 3 (three) times daily. 08/31/17   Lorin Picket, PA-C    Family History Family History  Problem Relation Age of Onset  . Hypertension Mother   . Factor V Leiden deficiency Mother   . Factor V Leiden deficiency Sister   . Factor V Leiden deficiency Maternal Grandmother   . Skin cancer Maternal Grandmother   . Lymphoma Paternal Grandfather   . Factor V Leiden deficiency Sister     Social History Social History   Tobacco Use  . Smoking status: Current Every Day Smoker  . Smokeless tobacco: Never Used  Substance Use Topics  . Alcohol use: No  . Drug use: No     Allergies   Patient has no known allergies.   Review of Systems Review of Systems  Constitutional: Positive for activity change. Negative for chills, fatigue and fever.  Skin: Positive for wound.  All other systems reviewed and are negative.    Physical Exam Triage Vital Signs ED Triage Vitals  Enc Vitals Group     BP 08/31/17 1925 119/73     Pulse Rate 08/31/17 1925 84     Resp 08/31/17 1925 18     Temp  08/31/17 1925 98.3 F (36.8 C)     Temp Source 08/31/17 1925 Oral     SpO2 08/31/17 1925 100 %     Weight 08/31/17 1927 200 lb (90.7 kg)     Height --      Head Circumference --      Peak Flow --      Pain Score 08/31/17 1927 8     Pain Loc --      Pain Edu? --      Excl. in [place]? --    No data found.  Updated Vital Signs BP 119/73 (BP Location: Left Arm)   Pulse 84   Temp 98.3 F (36.8 C) (Oral)   Resp 18   Wt 200 lb (90.7 kg)   LMP  (Exact Date)   SpO2 100%   BMI 33.28 kg/m   Visual Acuity Right Eye Distance:   Left Eye Distance:   Bilateral Distance:    Right Eye Near:   Left Eye Near:    Bilateral Near:     Physical Exam  Constitutional: She is oriented to person, place, and time. She appears well-developed and  well-nourished. No distress.  HENT:  Head: Normocephalic.  Eyes: Pupils are equal, round, and reactive to light.  Neck: Normal range of motion.  Musculoskeletal: Normal range of motion.  Neurological: She is alert and oriented to person, place, and time.  Skin: Skin is warm and dry. She is not diaphoretic.  Emanation of the left nondominant hand middle finger shows a small paronychia over the ulnar aspect nail fold.  Patient bites her fingernails is closely as she can.  Is caused previous paronychia in the past.  She states that nervous habit that she cannot break.   Psychiatric: She has a normal mood and affect. Her behavior is normal. Judgment and thought content normal.  Nursing note and vitals reviewed.    UC Treatments / Results  Labs (all labs ordered are listed, but only abnormal results are displayed) Labs Reviewed - No data to display  EKG None Radiology No results found.  Procedures Procedures (including critical care time)  Medications Ordered in UC Medications - No data to display   Initial Impression / Assessment and Plan / UC Course  I have reviewed the triage vital signs and the nursing notes.  Pertinent labs & imaging results that were available during my care of the patient were reviewed by me and considered in my medical decision making (see chart for details).     Plan: 1. Test/x-ray results and diagnosis reviewed with patient 2. rx as per orders; risks, benefits, potential side effects reviewed with patient 3. Recommend supportive treatment with compresses 3-4 times daily for 10 minutes each time. Dry thoroughly and apply Bactroban ointment to the nail fold.  We will also start her on a short course of doxycycline.  Encouraged her to stop biting her nails which is causing her problem.  She was given a stack splint for protection.  It worsens she should return to our clinic. 4. F/u prn if symptoms worsen or don't improve   Final Clinical Impressions(s)  / UC Diagnoses   Final diagnoses:  Paronychia of finger, left    ED Discharge Orders        Ordered    doxycycline (VIBRAMYCIN) 100 MG capsule  2 times daily     08/31/17 1958    mupirocin ointment (BACTROBAN) 2 %  3 times daily     08/31/17 1958  Controlled Substance Prescriptions [place] Controlled Substance Registry consulted? Not Applicable   Lorin Picket, PA-C 08/31/17 2015    Lorin Picket, PA-C 08/31/17 2016

## 2017-08-31 NOTE — Discharge Instructions (Signed)
Warm compresses on your finger for 10 minutes 3-4 times daily.  Dry thoroughly and apply mupirocin the nail fold.

## 2017-08-31 NOTE — ED Triage Notes (Signed)
Pt has paronychia on her left middle finger for 1 week. Said her finger is starting to go numb and is turning green.

## 2017-09-21 ENCOUNTER — Other Ambulatory Visit: Payer: Self-pay

## 2017-09-21 ENCOUNTER — Encounter: Payer: Self-pay | Admitting: Emergency Medicine

## 2017-09-21 ENCOUNTER — Ambulatory Visit
Admission: EM | Admit: 2017-09-21 | Discharge: 2017-09-21 | Disposition: A | Discharge status: Home / Self Care | Payer: Managed Care, Other (non HMO) | Attending: Family Medicine | Admitting: Family Medicine

## 2017-09-21 DIAGNOSIS — L739 Follicular disorder, unspecified: Secondary | ICD-10-CM | POA: Diagnosis not present

## 2017-09-21 MED ORDER — MUPIROCIN 2 % EX OINT: 1.0000 "application " | TOPICAL_OINTMENT | Freq: Three times a day (TID) | CUTANEOUS | 0 refills | Status: DC

## 2017-09-21 MED ORDER — CEPHALEXIN 500 MG PO CAPS: 500.0000 mg | ORAL_CAPSULE | Freq: Two times a day (BID) | ORAL | 0 refills | Status: DC

## 2017-09-21 NOTE — ED Triage Notes (Signed)
Patient c/o rash on her face, neck and chest that started a week ago.

## 2017-09-21 NOTE — ED Provider Notes (Signed)
[facility]    CSN: [id number] Arrival date & time: 09/21/17  1349     History   Chief Complaint Chief Complaint  Patient presents with  . Rash    APPOINTMENT    HPI 22 year old female hirsute presents with a rash on her face over the right temporal area ,anterior chest ,neck and back. The arms and legs apparently are spared. She was recently seen by her primary care physician because of a abscess in the groin which has since resolved after use of chlorhexidine scrub and continuation of the doxycycline and Bactroban.She had been diagnosed with MRSA according to her.He has been using a new body wash with oat meal and lotion but has been using this all of her body and does not conform to the pattern of her rash seen today.        Latoya Kelley is a 22 y.o. female.   HPI  Past Medical History:  Diagnosis Date  . Anxiety   . Depression   . Factor 5 Leiden mutation, heterozygous ([place])   . GERD (gastroesophageal reflux disease)    history by endo  . Panic attack     Patient Active Problem List   Diagnosis Date Noted  . 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 Erythrocytosis 10/26/2016    Past Surgical History:  Procedure Laterality Date  . INTRAUTERINE DEVICE (IUD) INSERTION N/A 11/14/2014   Procedure: INTRAUTERINE DEVICE (IUD) INSERTION;  Surgeon: Benjaman Kindler, MD;  Location: [facility];  Service: Gynecology;  Laterality: N/A;  . IUD REMOVAL N/A 11/14/2014   Procedure: INTRAUTERINE DEVICE (IUD) REMOVAL;  Surgeon: Benjaman Kindler, MD;  Location: [facility];  Service: Gynecology;  Laterality: N/A;  . LAPAROSCOPY N/A 11/14/2014   Procedure: LAPAROSCOPY DIAGNOSTIC;  Surgeon: Benjaman Kindler, MD;  Location: [facility];  Service: Gynecology;  Laterality: N/A;  . UPPER GI ENDOSCOPY    . WISDOM TOOTH EXTRACTION      OB History   None      Home Medications    Prior to Admission medications   Medication Sig Start Date End Date Taking? Authorizing  Provider  escitalopram (LEXAPRO) 20 MG tablet Take 20 mg by mouth at bedtime.   Yes [provider]  levonorgestrel (MIRENA) 20 MCG/24HR IUD 1 each by Intrauterine route once.   Yes [provider]  meloxicam (MOBIC) 15 MG tablet Take by mouth. 09/11/17 09/11/18 Yes [provider]  traZODone (DESYREL) 50 MG tablet  09/07/17  Yes [provider]  cephALEXin (KEFLEX) 500 MG capsule Take 1 capsule (500 mg total) by mouth 2 (two) times daily. 09/21/17   Lorin Picket, PA-C  mupirocin ointment (BACTROBAN) 2 % Apply 1 application topically 3 (three) times daily. 09/21/17   Lorin Picket, PA-C    Family History Family History  Problem Relation Age of Onset  . Hypertension Mother   . Factor V Leiden deficiency Mother   . Factor V Leiden deficiency Sister   . Factor V Leiden deficiency Maternal Grandmother   . Skin cancer Maternal Grandmother   . Lymphoma Paternal Grandfather   . Factor V Leiden deficiency Sister     Social History Social History   Tobacco Use  . Smoking status: Current Every Day Smoker    Types: Cigarettes  . Smokeless tobacco: Never Used  Substance Use Topics  . Alcohol use: No  . Drug use: No     Allergies   Patient has no known allergies.   Review of  Systems Review of Systems  Constitutional: Positive for activity change. Negative for chills, fatigue and fever.  Skin: Positive for color change and rash.  All other systems reviewed and are negative.    Physical Exam Triage Vital Signs ED Triage Vitals  Enc Vitals Group     BP 09/21/17 1415 123/83     Pulse Rate 09/21/17 1415 84     Resp 09/21/17 1415 16     Temp 09/21/17 1415 98.4 F (36.9 C)     Temp Source 09/21/17 1415 Oral     SpO2 09/21/17 1415 97 %     Weight 09/21/17 1412 200 lb (90.7 kg)     Height 09/21/17 1412 5\' 4"  (1.626 m)     Head Circumference --      Peak Flow --      Pain Score 09/21/17 1412 5     Pain Loc --      Pain Edu? --      Excl.  in [place]? --    No data found.  Updated Vital Signs BP 123/83 (BP Location: Left Arm)   Pulse 84   Temp 98.4 F (36.9 C) (Oral)   Resp 16   Ht 5\' 4"  (1.626 m)   Wt 200 lb (90.7 kg)   SpO2 97%   BMI 34.33 kg/m   Visual Acuity Right Eye Distance:   Left Eye Distance:   Bilateral Distance:    Right Eye Near:   Left Eye Near:    Bilateral Near:     Physical Exam  Constitutional: She is oriented to person, place, and time. She appears well-developed and well-nourished. No distress.  HENT:  Head: Normocephalic.  Eyes: Pupils are equal, round, and reactive to light. Right eye exhibits no discharge. Left eye exhibits no discharge.  Neck: Normal range of motion.  Musculoskeletal: Normal range of motion.  Neurological: She is alert and oriented to person, place, and time.  Skin: Skin is warm and dry. Rash noted. She is not diaphoretic.  examination of the skin shows a scattered papular papular red erythematous base with pustules that are blanchable over the face neck chest and back.Refer to photos for detail  Psychiatric: She has a normal mood and affect. Her behavior is normal. Judgment and thought content normal.  Nursing note and vitals reviewed.          UC Treatments / Results  Labs (all labs ordered are listed, but only abnormal results are displayed) Labs Reviewed - No data to display  EKG None  Radiology No results found.  Procedures Procedures (including critical care time)  Medications Ordered in UC Medications - No data to display  Initial Impression / Assessment and Plan / UC Course  I have reviewed the triage vital signs and the nursing notes.  Pertinent labs & imaging results that were available during my care of the patient were reviewed by me and considered in my medical decision making (see chart for details).     Plan: 1. Test/x-ray results and diagnosis reviewed with patient 2. rx as per orders; risks, benefits, potential side effects  reviewed with patient 3. Recommend supportive treatment with use of her Keflex and Bactroban ointment to the areas of rash. If she is not improving in a week or 2 she should follow-up with a dermatologist. 4. F/u prn if symptoms worsen or don't improve  Final Clinical Impressions(s) / UC Diagnoses   Final diagnoses:  Folliculitis   Discharge Instructions   None  ED Prescriptions    Medication Sig Dispense Auth. Provider   mupirocin ointment (BACTROBAN) 2 % Apply 1 application topically 3 (three) times daily. 30 g Crecencio Mc P, PA-C   cephALEXin (KEFLEX) 500 MG capsule Take 1 capsule (500 mg total) by mouth 2 (two) times daily. 20 capsule Lorin Picket, PA-C     Controlled Substance Prescriptions [place] Controlled Substance Registry consulted? Not Applicable   Lorin Picket, PA-C 09/21/17 1720

## 2018-03-08 ENCOUNTER — Encounter: Payer: Self-pay | Admitting: Emergency Medicine

## 2018-03-08 ENCOUNTER — Other Ambulatory Visit: Payer: Self-pay

## 2018-03-08 ENCOUNTER — Ambulatory Visit
Admission: EM | Admit: 2018-03-08 | Discharge: 2018-03-08 | Disposition: A | Discharge status: Home / Self Care | Payer: Managed Care, Other (non HMO) | Attending: Family Medicine | Admitting: Family Medicine

## 2018-03-08 DIAGNOSIS — S0185XA Open bite of other part of head, initial encounter: Secondary | ICD-10-CM | POA: Diagnosis not present

## 2018-03-08 DIAGNOSIS — W540XXA Bitten by dog, initial encounter: Secondary | ICD-10-CM | POA: Diagnosis not present

## 2018-03-08 MED ORDER — MUPIROCIN 2 % EX OINT: 1.0000 "application " | TOPICAL_OINTMENT | Freq: Three times a day (TID) | CUTANEOUS | 0 refills | Status: DC

## 2018-03-08 MED ORDER — AMOXICILLIN-POT CLAVULANATE 875-125 MG PO TABS: 1.0000 | ORAL_TABLET | Freq: Two times a day (BID) | ORAL | 0 refills | Status: DC

## 2018-03-08 NOTE — Discharge Instructions (Signed)
If you notice any increased redness ,swelling, pain ,discharge ,run fever or feel as if you have the flu return to our clinic immediately.  Pleasure taking care of you today !

## 2018-03-08 NOTE — ED Triage Notes (Signed)
Patient in today after begin bitten by her dog this morning. Patient's last Tdap was 06/27/16 per [facility] records. Patient brought in the dog's shot records and it is UTD on rabies vaccine.

## 2018-03-08 NOTE — ED Provider Notes (Signed)
[facility]    CSN: [id number] Arrival date & time: 03/08/18  1050     History   Chief Complaint Chief Complaint  Patient presents with  . Animal Bite    HPI Latoya Kelley is a 22 y.o. female.   HPI  22 year old female well-known to the clinic presents today after being bitten by her dog earlier this morning.  States that the dog refused to come into the house she attempted to lift him to carry him inside.  She states that he has a very sensitive left hind leg and this evidently bothered him.  He snapped at her sustaining a scratch on the sub-mandibular portion of her chin.  This is a very shallow laceration and a few puncture wounds that are superficial.  The dog has all of his immunizations specifically rabies is up-to-date.  The patient presents paperwork to that fact.  She is also current on tetanus toxoid.           Past Medical History:  Diagnosis Date  . Anxiety   . Depression   . Factor 5 Leiden mutation, heterozygous ([place])   . GERD (gastroesophageal reflux disease)    history by endo  . Panic attack     Patient Active Problem List   Diagnosis Date Noted  . 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 Erythrocytosis 10/26/2016    Past Surgical History:  Procedure Laterality Date  . INTRAUTERINE DEVICE (IUD) INSERTION N/A 11/14/2014   Procedure: INTRAUTERINE DEVICE (IUD) INSERTION;  Surgeon: Benjaman Kindler, MD;  Location: [facility];  Service: Gynecology;  Laterality: N/A;  . IUD REMOVAL N/A 11/14/2014   Procedure: INTRAUTERINE DEVICE (IUD) REMOVAL;  Surgeon: Benjaman Kindler, MD;  Location: [facility];  Service: Gynecology;  Laterality: N/A;  . LAPAROSCOPY N/A 11/14/2014   Procedure: LAPAROSCOPY DIAGNOSTIC;  Surgeon: Benjaman Kindler, MD;  Location: [facility];  Service: Gynecology;  Laterality: N/A;  . UPPER GI ENDOSCOPY    . WISDOM TOOTH EXTRACTION      OB History   None      Home Medications    Prior to Admission medications     Medication Sig Start Date End Date Taking? Authorizing Provider  escitalopram (LEXAPRO) 20 MG tablet Take 20 mg by mouth at bedtime.   Yes [provider]  levonorgestrel (MIRENA) 20 MCG/24HR IUD 1 each by Intrauterine route once.   Yes [provider]  mupirocin ointment (BACTROBAN) 2 % Apply 1 application topically 3 (three) times daily. 09/21/17  Yes Lorin Picket, PA-C  amoxicillin-clavulanate (AUGMENTIN) 875-125 MG tablet Take 1 tablet by mouth every 12 (twelve) hours. 03/08/18   Lorin Picket, PA-C  cephALEXin (KEFLEX) 500 MG capsule Take 1 capsule (500 mg total) by mouth 2 (two) times daily. 09/21/17   Lorin Picket, PA-C  mupirocin ointment (BACTROBAN) 2 % Apply 1 application topically 3 (three) times daily. 03/08/18   Lorin Picket, PA-C    Family History Family History  Problem Relation Age of Onset  . Hypertension Mother   . Factor V Leiden deficiency Mother   . Factor V Leiden deficiency Sister   . Factor V Leiden deficiency Maternal Grandmother   . Skin cancer Maternal Grandmother   . Lymphoma Paternal Grandfather   . Factor V Leiden deficiency Sister     Social History Social History   Tobacco Use  . Smoking status: Current Every Day Smoker    Packs/day: 1.00    Years: 4.00  Pack years: 4.00    Types: Cigarettes  . Smokeless tobacco: Never Used  Substance Use Topics  . Alcohol use: No  . Drug use: No     Allergies   Other   Review of Systems Review of Systems  Constitutional: Negative for activity change, appetite change, fatigue and fever.  Skin: Positive for wound.  All other systems reviewed and are negative.    Physical Exam Triage Vital Signs ED Triage Vitals  Enc Vitals Group     BP 03/08/18 1103 126/88     Pulse Rate 03/08/18 1103 75     Resp 03/08/18 1103 16     Temp 03/08/18 1103 97.6 F (36.4 C)     Temp Source 03/08/18 1103 Oral     SpO2 03/08/18 1103 99 %     Weight 03/08/18 1104 200 lb (90.7 kg)      Height 03/08/18 1104 5\' 4"  (1.626 m)     Head Circumference --      Peak Flow --      Pain Score 03/08/18 1104 7     Pain Loc --      Pain Edu? --      Excl. in [place]? --    No data found.  Updated Vital Signs BP 126/88 (BP Location: Right Arm)   Pulse 75   Temp 97.6 F (36.4 C) (Oral)   Resp 16   Ht 5\' 4"  (1.626 m)   Wt 200 lb (90.7 kg)   SpO2 99%   BMI 34.33 kg/m   Visual Acuity Right Eye Distance:   Left Eye Distance:   Bilateral Distance:    Right Eye Near:   Left Eye Near:    Bilateral Near:     Physical Exam  Constitutional: She is oriented to person, place, and time. She appears well-developed and well-nourished. No distress.  HENT:  Head: Normocephalic.  Eyes: Pupils are equal, round, and reactive to light. Right eye exhibits no discharge. Left eye exhibits no discharge.  Neck: Normal range of motion.  Musculoskeletal: Normal range of motion.  Neurological: She is alert and oriented to person, place, and time.  Skin: Skin is warm and dry. She is not diaphoretic.  Examination of the face on the right side shows a wound of the sub mandibular area.  It measures approximately 4 mm in length.  It is fairly superficial does not appear to penetrate the subcutaneous tissue.  She has other small superficial puncture wounds opening it.  See the accompanying photographs for detail  Psychiatric: She has a normal mood and affect. Her behavior is normal. Judgment and thought content normal.  Nursing note and vitals reviewed.        UC Treatments / Results  Labs (all labs ordered are listed, but only abnormal results are displayed) Labs Reviewed - No data to display  EKG None  Radiology No results found.  Procedures Procedures (including critical care time)  Medications Ordered in UC Medications - No data to display  Initial Impression / Assessment and Plan / UC Course  I have reviewed the triage vital signs and the nursing notes.  Pertinent labs &  imaging results that were available during my care of the patient were reviewed by me and considered in my medical decision making (see chart for details).   Animal control was notified of the bite.  This was a provoked incident.  It is current on her tetanus toxoid.  Place her on Augmentin twice daily for 5  days.  I will have her apply Bactroban ointment to the area 3 times daily after a thorough washing and drying.  Reviewed signs and symptoms of infection and she will return to our clinic or to the emergency room if you any of these occur.     Final Clinical Impressions(s) / UC Diagnoses   Final diagnoses:  Dog bite of face, initial encounter     Discharge Instructions     If you notice any increased redness ,swelling, pain ,discharge ,run fever or feel as if you have the flu return to our clinic immediately.  Pleasure taking care of you today !    ED Prescriptions    Medication Sig Dispense Auth. Provider   mupirocin ointment (BACTROBAN) 2 % Apply 1 application topically 3 (three) times daily. 22 g Crecencio Mc P, PA-C   amoxicillin-clavulanate (AUGMENTIN) 875-125 MG tablet Take 1 tablet by mouth every 12 (twelve) hours. 10 tablet Lorin Picket, PA-C     Controlled Substance Prescriptions [place] Controlled Substance Registry consulted? Not Applicable   Lorin Picket, PA-C 03/08/18 1304

## 2018-03-20 ENCOUNTER — Encounter: Payer: Self-pay | Admitting: Emergency Medicine

## 2018-03-20 ENCOUNTER — Other Ambulatory Visit: Payer: Self-pay

## 2018-03-20 ENCOUNTER — Ambulatory Visit
Admission: EM | Admit: 2018-03-20 | Discharge: 2018-03-20 | Disposition: A | Discharge status: Home / Self Care | Payer: Managed Care, Other (non HMO) | Attending: Family Medicine | Admitting: Family Medicine

## 2018-03-20 DIAGNOSIS — L03115 Cellulitis of right lower limb: Secondary | ICD-10-CM

## 2018-03-20 MED ORDER — DOXYCYCLINE HYCLATE 100 MG PO CAPS: 100.0000 mg | ORAL_CAPSULE | Freq: Two times a day (BID) | ORAL | 0 refills | Status: DC

## 2018-03-20 MED ORDER — MELOXICAM 15 MG PO TABS: 15.0000 mg | ORAL_TABLET | Freq: Every day | ORAL | 0 refills | Status: DC | PRN

## 2018-03-20 MED ORDER — SILVER SULFADIAZINE 1 % EX CREA: 1.0000 "application " | TOPICAL_CREAM | Freq: Two times a day (BID) | CUTANEOUS | 0 refills | Status: DC

## 2018-03-20 NOTE — Discharge Instructions (Signed)
Medication as prescribed.  Take care  Dr. Zadok Holaway  

## 2018-03-20 NOTE — ED Triage Notes (Signed)
Patient in today c/o rope burn on her right lower leg that occurred on Saturday (03/17/18). Patient was playing with her dogs and stepped on the rope and pulled it and burned her leg.

## 2018-03-20 NOTE — ED Provider Notes (Signed)
[facility]    CSN: [id number] Arrival date & time: 03/20/18  1408  History   Chief Complaint Chief Complaint  Patient presents with  . rope burn    right leg   HPI  22 year old female presents with a "rope burn".  Patient reports that she suffered a rope burn on Saturday.  Occurred when she got tangled up in a dog leash.  Reports moderate to severe pain.  Burn is located on the right lower leg above the ankle.  She reports associated redness and warmth.  No fever.  No chills.  She states that the pain is now radiating upwards.  No other reported symptoms.  No other complaints.  PMH, Surgical Hx, Family Hx, Social History reviewed and updated as below.  Past Medical History:  Diagnosis Date  . Anxiety   . Depression   . Factor 5 Leiden mutation, heterozygous ([place])   . GERD (gastroesophageal reflux disease)    history by endo  . Panic attack     Patient Active Problem List   Diagnosis Date Noted  . 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 Erythrocytosis 10/26/2016    Past Surgical History:  Procedure Laterality Date  . INTRAUTERINE DEVICE (IUD) INSERTION N/A 11/14/2014   Procedure: INTRAUTERINE DEVICE (IUD) INSERTION;  Surgeon: Benjaman Kindler, MD;  Location: [facility];  Service: Gynecology;  Laterality: N/A;  . IUD REMOVAL N/A 11/14/2014   Procedure: INTRAUTERINE DEVICE (IUD) REMOVAL;  Surgeon: Benjaman Kindler, MD;  Location: [facility];  Service: Gynecology;  Laterality: N/A;  . LAPAROSCOPY N/A 11/14/2014   Procedure: LAPAROSCOPY DIAGNOSTIC;  Surgeon: Benjaman Kindler, MD;  Location: [facility];  Service: Gynecology;  Laterality: N/A;  . UPPER GI ENDOSCOPY    . WISDOM TOOTH EXTRACTION      OB History   None      Home Medications    Prior to Admission medications   Medication Sig Start Date End Date Taking? Authorizing Provider  escitalopram (LEXAPRO) 20 MG tablet Take 20 mg by mouth at bedtime.   Yes [provider]  levonorgestrel (MIRENA)  20 MCG/24HR IUD 1 each by Intrauterine route once.   Yes [provider]  doxycycline (VIBRAMYCIN) 100 MG capsule Take 1 capsule (100 mg total) by mouth 2 (two) times daily. 03/20/18   Coral Spikes, DO  meloxicam (MOBIC) 15 MG tablet Take 1 tablet (15 mg total) by mouth daily as needed for pain. 03/20/18   Coral Spikes, DO  silver sulfADIAZINE (SILVADENE) 1 % cream Apply 1 application topically 2 (two) times daily. 03/20/18   Coral Spikes, DO    Family History Family History  Problem Relation Age of Onset  . Hypertension Mother   . Factor V Leiden deficiency Mother   . Factor V Leiden deficiency Sister   . Factor V Leiden deficiency Maternal Grandmother   . Skin cancer Maternal Grandmother   . Lymphoma Paternal Grandfather   . Factor V Leiden deficiency Sister     Social History Social History   Tobacco Use  . Smoking status: Current Every Day Smoker    Packs/day: 1.00    Years: 4.00    Pack years: 4.00    Types: Cigarettes  . Smokeless tobacco: Never Used  Substance Use Topics  . Alcohol use: No  . Drug use: No     Allergies   Other   Review of Systems Review of Systems  Constitutional: Negative for fever.  Skin: Positive for wound.  Physical Exam Triage Vital Signs ED Triage Vitals [03/20/18 1426]  Enc Vitals Group     BP 117/85     Pulse Rate 88     Resp 16     Temp 98.6 F (37 C)     Temp Source Oral     SpO2 100 %     Weight 200 lb (90.7 kg)     Height 5\' 5"  (1.651 m)     Head Circumference      Peak Flow      Pain Score 10     Pain Loc      Pain Edu?      Excl. in [place]?    Updated Vital Signs BP 117/85 (BP Location: Left Arm)   Pulse 88   Temp 98.6 F (37 C) (Oral)   Resp 16   Ht 5\' 5"  (1.651 m)   Wt 90.7 kg   SpO2 100%   BMI 33.28 kg/m   Visual Acuity Right Eye Distance:   Left Eye Distance:   Bilateral Distance:    Right Eye Near:   Left Eye Near:    Bilateral Near:     Physical Exam  Constitutional: She is  oriented to person, place, and time. She appears well-developed. No distress.  HENT:  Head: Normocephalic and atraumatic.  Cardiovascular: Normal rate and regular rhythm.  Pulmonary/Chest: Effort normal and breath sounds normal. No respiratory distress.  Neurological: She is alert and oriented to person, place, and time.  Skin:  R lower leg -near circumferential superficial burn noted.  Surrounding erythema which extends to the lateral malleolus. Warmth.  Psychiatric: Her behavior is normal.  Flat affect.  Nursing note and vitals reviewed.  UC Treatments / Results  Labs (all labs ordered are listed, but only abnormal results are displayed) Labs Reviewed - No data to display  EKG None  Radiology No results found.  Procedures Procedures (including critical care time)  Medications Ordered in UC Medications - No data to display  Initial Impression / Assessment and Plan / UC Course  I have reviewed the triage vital signs and the nursing notes.  Pertinent labs & imaging results that were available during my care of the patient were reviewed by me and considered in my medical decision making (see chart for details).    22 year old female presents with a superficial burn and subsequent cellulitis of the right lower extremity.  Treating with Silvadene and doxycycline.  Meloxicam for pain.  Final Clinical Impressions(s) / UC Diagnoses   Final diagnoses:  Cellulitis of right lower extremity     Discharge Instructions     Medication as prescribed.    Take care  Dr. Lacinda Axon    ED Prescriptions    Medication Sig Dispense Auth. Provider   silver sulfADIAZINE (SILVADENE) 1 % cream Apply 1 application topically 2 (two) times daily. 50 g Stepheni Cameron G, DO   doxycycline (VIBRAMYCIN) 100 MG capsule Take 1 capsule (100 mg total) by mouth 2 (two) times daily. 14 capsule Jahmani Staup G, DO   meloxicam (MOBIC) 15 MG tablet Take 1 tablet (15 mg total) by mouth daily as needed for pain. 30  tablet Coral Spikes, DO     Controlled Substance Prescriptions [place] Controlled Substance Registry consulted? Not Applicable   Coral Spikes, DO 03/20/18 1617

## 2018-06-04 ENCOUNTER — Encounter: Payer: Self-pay | Admitting: Emergency Medicine

## 2018-06-04 ENCOUNTER — Ambulatory Visit
Admission: EM | Admit: 2018-06-04 | Discharge: 2018-06-04 | Disposition: A | Discharge status: Home / Self Care | Payer: Managed Care, Other (non HMO) | Attending: Family Medicine | Admitting: Family Medicine

## 2018-06-04 ENCOUNTER — Other Ambulatory Visit: Payer: Self-pay

## 2018-06-04 DIAGNOSIS — T162XXA Foreign body in left ear, initial encounter: Secondary | ICD-10-CM | POA: Insufficient documentation

## 2018-06-04 NOTE — ED Provider Notes (Signed)
[facility] ____________________________________________  Time seen: Approximately 3:21 PM  I have reviewed the triage vital signs and the nursing notes.   HISTORY  Chief Complaint Foreign Body in Ear (APPT)   HPI Latoya Kelley is a 23 y.o. female for evaluation of foreign body to left ear.  Patient states that she was cleaning out her ear this morning and believes the cotton swab got stuck in her ear.  States tried to flush it out with peroxide and tried to get it out but unable to do so.  States minimal discomfort.  Denies other complaints.  No recent sickness, cough or congestion.  Denies right ear discomfort.  Reports otherwise doing well denies other complaints.   Past Medical History:  Diagnosis Date  . Anxiety   . Depression   . Factor 5 Leiden mutation, heterozygous ([place])   . GERD (gastroesophageal reflux disease)    history by endo  . Panic attack     Patient Active Problem List   Diagnosis Date Noted  . 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 Erythrocytosis 10/26/2016    Past Surgical History:  Procedure Laterality Date  . INTRAUTERINE DEVICE (IUD) INSERTION N/A 11/14/2014   Procedure: INTRAUTERINE DEVICE (IUD) INSERTION;  Surgeon: Benjaman Kindler, MD;  Location: [facility];  Service: Gynecology;  Laterality: N/A;  . IUD REMOVAL N/A 11/14/2014   Procedure: INTRAUTERINE DEVICE (IUD) REMOVAL;  Surgeon: Benjaman Kindler, MD;  Location: [facility];  Service: Gynecology;  Laterality: N/A;  . LAPAROSCOPY N/A 11/14/2014   Procedure: LAPAROSCOPY DIAGNOSTIC;  Surgeon: Benjaman Kindler, MD;  Location: [facility];  Service: Gynecology;  Laterality: N/A;  . UPPER GI ENDOSCOPY    . WISDOM TOOTH EXTRACTION       No current facility-administered medications for this encounter.   Current Outpatient Medications:  .  escitalopram (LEXAPRO) 20 MG tablet, Take 20 mg by mouth at bedtime., Disp: , Rfl:  .  levonorgestrel (MIRENA) 20 MCG/24HR IUD, 1 each by  Intrauterine route once., Disp: , Rfl:  .  doxycycline (VIBRAMYCIN) 100 MG capsule, Take 1 capsule (100 mg total) by mouth 2 (two) times daily., Disp: 14 capsule, Rfl: 0 .  meloxicam (MOBIC) 15 MG tablet, Take 1 tablet (15 mg total) by mouth daily as needed for pain., Disp: 30 tablet, Rfl: 0 .  silver sulfADIAZINE (SILVADENE) 1 % cream, Apply 1 application topically 2 (two) times daily., Disp: 50 g, Rfl: 0  Allergies Other  Family History  Problem Relation Age of Onset  . Hypertension Mother   . Factor V Leiden deficiency Mother   . Factor V Leiden deficiency Sister   . Factor V Leiden deficiency Maternal Grandmother   . Skin cancer Maternal Grandmother   . Lymphoma Paternal Grandfather   . Factor V Leiden deficiency Sister     Social History Social History   Tobacco Use  . Smoking status: Current Every Day Smoker    Packs/day: 1.00    Years: 4.00    Pack years: 4.00    Types: Cigarettes  . Smokeless tobacco: Never Used  Substance Use Topics  . Alcohol use: No  . Drug use: No    Review of Systems Constitutional: No fever ENT: No sore throat. As above.  Cardiovascular: Denies chest pain. Respiratory: Denies shortness of breath. Gastrointestinal: No abdominal pain.   Musculoskeletal: Negative for back pain. Skin: Negative for rash.   ____________________________________________   PHYSICAL EXAM:  VITAL SIGNS: ED Triage Vitals  Enc Vitals Group  BP 06/04/18 1509 123/69     Pulse Rate 06/04/18 1509 71     Resp 06/04/18 1509 18     Temp 06/04/18 1509 98 F (36.7 C)     Temp Source 06/04/18 1509 Oral     SpO2 06/04/18 1509 100 %     Weight 06/04/18 1507 200 lb (90.7 kg)     Height 06/04/18 1507 5\' 5"  (1.651 m)     Head Circumference --      Peak Flow --      Pain Score --      Pain Loc --      Pain Edu? --      Excl. in [place]? --     Constitutional: Alert and oriented. Well appearing and in no acute distress. Eyes: Conjunctivae are normal. Head:  Atraumatic. No sinus tenderness to palpation. No swelling. No erythema.  Ears: Left: Nontender, normal external skin, tragus piercing, canal with white foreign body present, removed, no retained foreign body, no erythema, normal TM.  Right: Nontender, mild cerumen present, otherwise normal TM, no erythema.  Nose:No nasal congestion   Mouth/Throat: Mucous membranes are moist.  Neck: No stridor.  No cervical spine tenderness to palpation. Hematological/Lymphatic/Immunilogical: No cervical lymphadenopathy. Cardiovascular: Normal rate, regular rhythm. Grossly normal heart sounds.  Good peripheral circulation. Respiratory: Normal respiratory effort.  No retractions. No wheezes, rales or rhonchi. Good air movement.  Musculoskeletal: Ambulatory with steady gait.  Neurologic:  Normal speech and language. No gait instability. Skin:  Skin appears warm, dry and intact. No rash noted. Psychiatric: Mood and affect are normal. Speech and behavior are normal. ___________________________________________   LABS (all labs ordered are listed, but only abnormal results are displayed)  Labs Reviewed - No data to display ____________________________________________   PROCEDURES Procedures  Seizure explained and verbal consent obtained.  Sterile alligator forceps utilized and removed white foreign body from left ear canal, consistent with cotton swab tip, no retained foreign body.  Patient tolerated well.  INITIAL IMPRESSION / ASSESSMENT AND PLAN / ED COURSE  Pertinent labs & imaging results that were available during my care of the patient were reviewed by me and considered in my medical decision making (see chart for details).  Well-appearing patient.  No acute distress.  Left ear foreign body removed with alligator forceps.  Patient tolerated well.  Encourage supportive care.  Discussed follow up with Primary care physician this week. Discussed follow up and return parameters including no resolution or  any worsening concerns. Patient verbalized understanding and agreed to plan.   ____________________________________________   FINAL CLINICAL IMPRESSION(S) / ED DIAGNOSES  Final diagnoses:  Acute foreign body of left ear, initial encounter     ED Discharge Orders    None       Note: This dictation was prepared with Dragon dictation along with smaller phrase technology. Any transcriptional errors that result from this process are unintentional.         Marylene Land, NP 06/04/18 1641

## 2018-06-04 NOTE — ED Triage Notes (Signed)
Patient c/o cotton stuck in her left ear this morning.

## 2018-06-29 ENCOUNTER — Ambulatory Visit (INDEPENDENT_AMBULATORY_CARE_PROVIDER_SITE_OTHER): Payer: Managed Care, Other (non HMO)

## 2018-06-29 ENCOUNTER — Ambulatory Visit
Admission: EM | Admit: 2018-06-29 | Discharge: 2018-06-29 | Disposition: A | Discharge status: Home / Self Care | Payer: Managed Care, Other (non HMO) | Attending: Family Medicine | Admitting: Family Medicine

## 2018-06-29 ENCOUNTER — Encounter: Payer: Self-pay | Admitting: Emergency Medicine

## 2018-06-29 ENCOUNTER — Other Ambulatory Visit: Payer: Self-pay

## 2018-06-29 DIAGNOSIS — W010XXA Fall on same level from slipping, tripping and stumbling without subsequent striking against object, initial encounter: Secondary | ICD-10-CM

## 2018-06-29 DIAGNOSIS — S300XXA Contusion of lower back and pelvis, initial encounter: Secondary | ICD-10-CM

## 2018-06-29 DIAGNOSIS — R52 Pain, unspecified: Secondary | ICD-10-CM

## 2018-06-29 DIAGNOSIS — M5442 Lumbago with sciatica, left side: Secondary | ICD-10-CM

## 2018-06-29 DIAGNOSIS — M545 Low back pain: Secondary | ICD-10-CM

## 2018-06-29 DIAGNOSIS — M25552 Pain in left hip: Secondary | ICD-10-CM

## 2018-06-29 DIAGNOSIS — Y92017 Garden or yard in single-family (private) house as the place of occurrence of the external cause: Secondary | ICD-10-CM

## 2018-06-29 DIAGNOSIS — R339 Retention of urine, unspecified: Secondary | ICD-10-CM

## 2018-06-29 DIAGNOSIS — Z87898 Personal history of other specified conditions: Secondary | ICD-10-CM

## 2018-06-29 MED ORDER — KETOROLAC TROMETHAMINE 60 MG/2ML IM SOLN
60.0000 mg | Freq: Once | INTRAMUSCULAR | Status: AC
  Administered 2018-06-29: 60 mg via INTRAMUSCULAR

## 2018-06-29 NOTE — Discharge Instructions (Addendum)
Recommend patient go to Emergency Department for further evaluation due to recent low back injury and bladder/bowel symptoms

## 2018-06-29 NOTE — ED Provider Notes (Signed)
[facility]    CSN: [id number] Arrival date & time: 06/29/18  1340     History   Chief Complaint Chief Complaint  Patient presents with  . Fall    appt  . Back Pain    HPI Romi B Thurlow is a 23 y.o. female.   23 yo female with a c/o low back pain after falling in her yard 3 days ago. States she landed on her back. Also fell again today. States pain radiates down her left leg. Also has noticed that when she urinates and has urge for bowel movement she has to push to get it out. Denies any saddle anesthesia, but states "I always have numbness".   The history is provided by the patient.  Fall   Back Pain    Past Medical History:  Diagnosis Date  . Anxiety   . Depression   . Factor 5 Leiden mutation, heterozygous ([place])   . GERD (gastroesophageal reflux disease)    history by endo  . Panic attack     Patient Active Problem List   Diagnosis Date Noted  . 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 Erythrocytosis 10/26/2016    Past Surgical History:  Procedure Laterality Date  . INTRAUTERINE DEVICE (IUD) INSERTION N/A 11/14/2014   Procedure: INTRAUTERINE DEVICE (IUD) INSERTION;  Surgeon: Benjaman Kindler, MD;  Location: [facility];  Service: Gynecology;  Laterality: N/A;  . IUD REMOVAL N/A 11/14/2014   Procedure: INTRAUTERINE DEVICE (IUD) REMOVAL;  Surgeon: Benjaman Kindler, MD;  Location: [facility];  Service: Gynecology;  Laterality: N/A;  . LAPAROSCOPY N/A 11/14/2014   Procedure: LAPAROSCOPY DIAGNOSTIC;  Surgeon: Benjaman Kindler, MD;  Location: [facility];  Service: Gynecology;  Laterality: N/A;  . UPPER GI ENDOSCOPY    . WISDOM TOOTH EXTRACTION      OB History   No obstetric history on file.      Home Medications    Prior to Admission medications   Medication Sig Start Date End Date Taking? Authorizing Provider  escitalopram (LEXAPRO) 20 MG tablet Take 20 mg by mouth at bedtime.   Yes [provider]  levonorgestrel (MIRENA) 20 MCG/24HR  IUD 1 each by Intrauterine route once.   Yes [provider]  doxycycline (VIBRAMYCIN) 100 MG capsule Take 1 capsule (100 mg total) by mouth 2 (two) times daily. 03/20/18   Coral Spikes, DO  meloxicam (MOBIC) 15 MG tablet Take 1 tablet (15 mg total) by mouth daily as needed for pain. 03/20/18   Coral Spikes, DO  silver sulfADIAZINE (SILVADENE) 1 % cream Apply 1 application topically 2 (two) times daily. 03/20/18   Coral Spikes, DO    Family History Family History  Problem Relation Age of Onset  . Hypertension Mother   . Factor V Leiden deficiency Mother   . Factor V Leiden deficiency Sister   . Factor V Leiden deficiency Maternal Grandmother   . Skin cancer Maternal Grandmother   . Lymphoma Paternal Grandfather   . Factor V Leiden deficiency Sister     Social History Social History   Tobacco Use  . Smoking status: Current Every Day Smoker    Packs/day: 1.00    Years: 4.00    Pack years: 4.00    Types: Cigarettes  . Smokeless tobacco: Never Used  Substance Use Topics  . Alcohol use: No  . Drug use: No     Allergies   Other   Review of Systems Review of Systems  Musculoskeletal: Positive  for back pain.     Physical Exam Triage Vital Signs ED Triage Vitals  Enc Vitals Group     BP 06/29/18 1440 114/70     Pulse Rate 06/29/18 1440 71     Resp 06/29/18 1440 18     Temp 06/29/18 1440 98.3 F (36.8 C)     Temp Source 06/29/18 1440 Oral     SpO2 06/29/18 1440 100 %     Weight 06/29/18 1437 211 lb (95.7 kg)     Height 06/29/18 1437 5\' 5"  (1.651 m)     Head Circumference --      Peak Flow --      Pain Score 06/29/18 1437 7     Pain Loc --      Pain Edu? --      Excl. in [place]? --    No data found.  Updated Vital Signs BP 114/70 (BP Location: Left Arm)   Pulse 71   Temp 98.3 F (36.8 C) (Oral)   Resp 18   Ht 5\' 5"  (1.651 m)   Wt 95.7 kg   SpO2 100%   BMI 35.11 kg/m   Visual Acuity Right Eye Distance:   Left Eye Distance:   Bilateral  Distance:    Right Eye Near:   Left Eye Near:    Bilateral Near:     Physical Exam Vitals signs and nursing note reviewed.  Constitutional:      General: She is not in acute distress.    Appearance: She is well-developed. She is not diaphoretic.  Musculoskeletal:        General: Tenderness present.     Lumbar back: She exhibits tenderness and spasm. She exhibits normal range of motion, no bony tenderness, no swelling, no edema, no deformity, no laceration, no pain and normal pulse.  Skin:    General: Skin is warm and dry.     Findings: No erythema or rash.  Neurological:     Mental Status: She is alert.     Motor: No abnormal muscle tone.     Deep Tendon Reflexes: Reflexes are normal and symmetric. Reflexes normal.     Reflex Scores:      Patellar reflexes are 2+ on the right side and 2+ on the left side.     UC Treatments / Results  Labs (all labs ordered are listed, but only abnormal results are displayed) Labs Reviewed - No data to display  EKG None  Radiology Dg Lumbar Spine Complete  Result Date: 06/29/2018 CLINICAL DATA:  Low back pain and left hip pain. The patient fell today and 3 days ago. EXAM: LUMBAR SPINE - COMPLETE 4+ VIEW COMPARISON:  None. FINDINGS: There is no evidence of lumbar spine fracture. Alignment is normal. Intervertebral disc spaces are maintained. No facet arthritis. IMPRESSION: Normal lumbar spine. Electronically Signed   By: Lorriane Shire M.D.   On: 06/29/2018 15:32   Dg Hip Unilat With Pelvis 2-3 Views Left  Result Date: 06/29/2018 CLINICAL DATA:  Left hip pain. The patient fell today and 3 days ago. EXAM: DG HIP (WITH OR WITHOUT PELVIS) 2-3V LEFT COMPARISON:  None. FINDINGS: There is no evidence of hip fracture or dislocation. There is no evidence of arthropathy or other focal bone abnormality. IUD in place. IMPRESSION: Normal left hip. Electronically Signed   By: Lorriane Shire M.D.   On: 06/29/2018 15:33    Procedures Procedures (including  critical care time)  Medications Ordered in UC Medications  ketorolac (TORADOL) injection  60 mg (60 mg Intramuscular Given 06/29/18 1603)    Initial Impression / Assessment and Plan / UC Course  I have reviewed the triage vital signs and the nursing notes.  Pertinent labs & imaging results that were available during my care of the patient were reviewed by me and considered in my medical decision making (see chart for details).      Final Clinical Impressions(s) / UC Diagnoses   Final diagnoses:  Pain  Lumbar contusion, initial encounter  Acute midline low back pain with left-sided sciatica  H/O urinary retention     Discharge Instructions     Recommend patient go to Emergency Department for further evaluation due to recent low back injury and bladder/bowel symptoms    ED Prescriptions    None     1. x-ray results and diagnosis reviewed with patient 2. Given toradol 60mg  IM x 1 3. Recommend patient go to Emergency Department for further evaluation and management   Controlled Substance Prescriptions [place] Controlled Substance Registry consulted? Not Applicable   Norval Gable, MD 06/29/18 [phone]

## 2018-06-29 NOTE — ED Triage Notes (Signed)
Pt fell in her yard 3 days ago. Then she fell again today in her yard. She reports that her lower back and hip area and down her left leg. She also feels like it is making it hard for her to have bowel movements.

## 2018-11-09 ENCOUNTER — Other Ambulatory Visit: Payer: Managed Care, Other (non HMO)

## 2018-11-09 ENCOUNTER — Telehealth: Payer: Self-pay

## 2018-11-09 DIAGNOSIS — Z20822 Contact with and (suspected) exposure to covid-19: Secondary | ICD-10-CM

## 2018-11-09 NOTE — Telephone Encounter (Signed)
Latoya Kelley with [facility] Dept. Request COVID test for exposure. Scheduled for today.

## 2018-11-13 ENCOUNTER — Telehealth: Payer: Self-pay | Admitting: Hematology

## 2018-11-13 LAB — NOVEL CORONAVIRUS, NAA: SARS-CoV-2, NAA: NOT DETECTED

## 2018-11-13 NOTE — Telephone Encounter (Signed)
Pt called to get her covid 19 lab results and a negative result was given to pt.
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
## 2019-01-02 ENCOUNTER — Encounter: Payer: Self-pay | Admitting: *Deleted

## 2019-01-02 ENCOUNTER — Encounter
Admission: RE | Admit: 2019-01-02 | Discharge: 2019-01-02 | Disposition: A | Discharge status: Home / Self Care | Payer: Managed Care, Other (non HMO) | Source: Ambulatory Visit | Attending: Obstetrics and Gynecology | Admitting: Obstetrics and Gynecology

## 2019-01-02 ENCOUNTER — Other Ambulatory Visit: Payer: Self-pay

## 2019-01-02 DIAGNOSIS — Z20828 Contact with and (suspected) exposure to other viral communicable diseases: Secondary | ICD-10-CM | POA: Insufficient documentation

## 2019-01-02 DIAGNOSIS — Z01812 Encounter for preprocedural laboratory examination: Secondary | ICD-10-CM | POA: Insufficient documentation

## 2019-01-02 HISTORY — DX: Headache, unspecified: R51.9

## 2019-01-02 HISTORY — DX: Malignant (primary) neoplasm, unspecified: C80.1

## 2019-01-02 NOTE — Patient Instructions (Signed)
Your procedure is scheduled on: 01-07-19 MONDAY Report to Same Day Surgery 2nd floor medical mall [facility] Entrance-take elevator on left to 2nd floor.  Check in with surgery information desk.) To find out your arrival time please call [phone] between 1PM - 3PM on 01-04-19 FRIDAY  Remember: Instructions that are not followed completely may result in serious medical risk, up to and including death, or upon the discretion of your surgeon and anesthesiologist your surgery may need to be rescheduled.    _x___ 1. Do not eat food after midnight the night before your procedure. NO GUM OR CANDY AFTER MIDNIGHT. You may drink clear liquids up to 2 hours before you are scheduled to arrive at the hospital for your procedure.  Do not drink clear liquids within 2 hours of your scheduled arrival to the hospital.  Clear liquids include  --Water or Apple juice without pulp  --Clear carbohydrate beverage such as ClearFast or Gatorade  --Black Coffee or Clear Tea (No milk, no creamers, do not add anything to the coffee or Tea   ____Ensure clear carbohydrate drink on the way to the hospital for bariatric patients  _X___Ensure clear carbohydrate drink 3 hours before surgery.     __x__ 2. No Alcohol for 24 hours before or after surgery.   __x__3. No Smoking or e-cigarettes for 24 prior to surgery.  Do not use any chewable tobacco products for at least 6 hour prior to surgery   ____  4. Bring all medications with you on the day of surgery if instructed.    __x__ 5. Notify your doctor if there is any change in your medical condition     (cold, fever, infections).    x___6. On the morning of surgery brush your teeth with toothpaste and water.  You may rinse your mouth with mouth wash if you wish.  Do not swallow any toothpaste or mouthwash.   Do not wear jewelry, make-up, hairpins, clips or nail polish.  Do not wear lotions, powders, or perfumes. You may wear deodorant.  Do not shave 48 hours prior  to surgery. Men may shave face and neck.  Do not bring valuables to the hospital.    [facility] is not responsible for any belongings or valuables.               Contacts, dentures or bridgework may not be worn into surgery.  Leave your suitcase in the car. After surgery it may be brought to your room.  For patients admitted to the hospital, discharge time is determined by your treatment team.  _  Patients discharged the day of surgery will not be allowed to drive home.  You will need someone to drive you home and stay with you the night of your procedure.    Please read over the following fact sheets that you were given:   [facility] Preparing for Surgery and or MRSA Information   ____ Take anti-hypertensive listed below, cardiac, seizure, asthma, anti-reflux and psychiatric medicines. These include:  1. NONE  2.  3.  4.  5.  6.  ____Fleets enema or Magnesium Citrate as directed.   _x___ Use CHG Soap or sage wipes as directed on instruction sheet   ____ Use inhalers on the day of surgery and bring to hospital day of surgery  ____ Stop Metformin and Janumet 2 days prior to surgery.    ____ Take 1/2 of usual insulin dose the night before surgery and none on the morning surgery.  ____ Follow recommendations from Cardiologist, Pulmonologist or PCP regarding stopping Aspirin, Coumadin, Plavix ,Eliquis, Effient, or Pradaxa, and Pletal.  X____Stop Anti-inflammatories such as Advil, Aleve, Ibuprofen, Motrin, Naproxen, Naprosyn, Goodies powders or aspirin products NOW- OK to take Tylenol    _x___ Stop supplements until after surgery-STOP STRESS BALLS (SLEEP AID) NOW-MAY RESUME AFTER SURGERY   ____ Bring C-Pap to the hospital.

## 2019-01-03 ENCOUNTER — Other Ambulatory Visit: Payer: Self-pay

## 2019-01-03 ENCOUNTER — Other Ambulatory Visit
Admission: RE | Admit: 2019-01-03 | Discharge: 2019-01-03 | Disposition: A | Discharge status: Home / Self Care | Payer: Managed Care, Other (non HMO) | Source: Ambulatory Visit | Attending: Obstetrics and Gynecology | Admitting: Obstetrics and Gynecology

## 2019-01-03 DIAGNOSIS — Z01812 Encounter for preprocedural laboratory examination: Secondary | ICD-10-CM | POA: Diagnosis not present

## 2019-01-03 LAB — CBC
HCT: 47.2 % — ABNORMAL HIGH (ref 36.0–46.0)
Hemoglobin: 15.9 g/dL — ABNORMAL HIGH (ref 12.0–15.0)
MCH: 31.5 pg (ref 26.0–34.0)
MCHC: 33.7 g/dL (ref 30.0–36.0)
MCV: 93.5 fL (ref 80.0–100.0)
Platelets: 270 10*3/uL (ref 150–400)
RBC: 5.05 MIL/uL (ref 3.87–5.11)
RDW: 12.5 % (ref 11.5–15.5)
WBC: 8.2 10*3/uL (ref 4.0–10.5)
nRBC: 0 % (ref 0.0–0.2)

## 2019-01-03 LAB — BASIC METABOLIC PANEL
Anion gap: 12 (ref 5–15)
BUN: 9 mg/dL (ref 6–20)
CO2: 21 mmol/L — ABNORMAL LOW (ref 22–32)
Calcium: 8.8 mg/dL — ABNORMAL LOW (ref 8.9–10.3)
Chloride: 105 mmol/L (ref 98–111)
Creatinine, Ser: 0.91 mg/dL (ref 0.44–1.00)
GFR calc Af Amer: >60 mL/min (ref >60)
GFR calc non Af Amer: >60 mL/min (ref >60)
Glucose, Bld: 142 mg/dL — ABNORMAL HIGH (ref 70–99)
Potassium: 3.8 mmol/L (ref 3.5–5.1)
Sodium: 138 mmol/L (ref 135–145)

## 2019-01-03 LAB — TYPE AND SCREEN
ABO/RH(D): A NEG
Antibody Screen: NEGATIVE

## 2019-01-04 LAB — SARS CORONAVIRUS 2 (TAT 6-24 HRS): SARS Coronavirus 2: NEGATIVE

## 2019-01-07 ENCOUNTER — Encounter
Admission: RE | Disposition: A | Discharge status: Home / Self Care | Payer: Self-pay | Source: Ambulatory Visit | Attending: Obstetrics and Gynecology

## 2019-01-07 ENCOUNTER — Ambulatory Visit: Payer: Managed Care, Other (non HMO) | Admitting: Certified Registered Nurse Anesthetist

## 2019-01-07 ENCOUNTER — Ambulatory Visit
Admission: RE | Admit: 2019-01-07 | Discharge: 2019-01-07 | Disposition: A | Discharge status: Home / Self Care | Payer: Managed Care, Other (non HMO) | Source: Ambulatory Visit | Attending: Obstetrics and Gynecology | Admitting: Obstetrics and Gynecology

## 2019-01-07 ENCOUNTER — Encounter: Payer: Self-pay | Admitting: *Deleted

## 2019-01-07 ENCOUNTER — Other Ambulatory Visit: Payer: Self-pay

## 2019-01-07 DIAGNOSIS — N838 Other noninflammatory disorders of ovary, fallopian tube and broad ligament: Secondary | ICD-10-CM | POA: Diagnosis not present

## 2019-01-07 DIAGNOSIS — F329 Major depressive disorder, single episode, unspecified: Secondary | ICD-10-CM | POA: Diagnosis not present

## 2019-01-07 DIAGNOSIS — F17210 Nicotine dependence, cigarettes, uncomplicated: Secondary | ICD-10-CM | POA: Diagnosis not present

## 2019-01-07 DIAGNOSIS — Z79899 Other long term (current) drug therapy: Secondary | ICD-10-CM | POA: Diagnosis not present

## 2019-01-07 DIAGNOSIS — G8929 Other chronic pain: Secondary | ICD-10-CM | POA: Insufficient documentation

## 2019-01-07 DIAGNOSIS — Z792 Long term (current) use of antibiotics: Secondary | ICD-10-CM | POA: Insufficient documentation

## 2019-01-07 DIAGNOSIS — N920 Excessive and frequent menstruation with regular cycle: Secondary | ICD-10-CM | POA: Diagnosis not present

## 2019-01-07 DIAGNOSIS — R102 Pelvic and perineal pain: Secondary | ICD-10-CM | POA: Diagnosis present

## 2019-01-07 HISTORY — PX: LAPAROSCOPIC HYSTERECTOMY: SHX1926

## 2019-01-07 LAB — POCT PREGNANCY, URINE: Preg Test, Ur: NEGATIVE

## 2019-01-07 SURGERY — HYSTERECTOMY, TOTAL, LAPAROSCOPIC
Anesthesia: General | Site: Vagina

## 2019-01-07 MED ORDER — FAMOTIDINE 20 MG PO TABS
20.0000 mg | ORAL_TABLET | Freq: Once | ORAL | Status: AC
  Administered 2019-01-07: 20 mg via ORAL

## 2019-01-07 MED ORDER — ACETAMINOPHEN 10 MG/ML IV SOLN
INTRAVENOUS | Status: DC | PRN
  Administered 2019-01-07: 1000 mg via INTRAVENOUS

## 2019-01-07 MED ORDER — KETOROLAC TROMETHAMINE 30 MG/ML IJ SOLN
INTRAMUSCULAR | Status: DC | PRN
  Administered 2019-01-07: 30 mg via INTRAVENOUS

## 2019-01-07 MED ORDER — BUPIVACAINE HCL (PF) 0.5 % IJ SOLN
INTRAMUSCULAR | Status: AC
  Filled 2019-01-07: qty 30

## 2019-01-07 MED ORDER — SODIUM CHLORIDE FLUSH 0.9 % IV SOLN
INTRAVENOUS | Status: AC
  Filled 2019-01-07: qty 10

## 2019-01-07 MED ORDER — LIDOCAINE HCL (CARDIAC) PF 100 MG/5ML IV SOSY
PREFILLED_SYRINGE | INTRAVENOUS | Status: DC | PRN
  Administered 2019-01-07: 100 mg via INTRAVENOUS

## 2019-01-07 MED ORDER — SUCCINYLCHOLINE CHLORIDE 20 MG/ML IJ SOLN
INTRAMUSCULAR | Status: DC | PRN
  Administered 2019-01-07: 100 mg via INTRAVENOUS

## 2019-01-07 MED ORDER — PROPOFOL 10 MG/ML IV BOLUS
INTRAVENOUS | Status: DC | PRN
  Administered 2019-01-07: 180 mg via INTRAVENOUS

## 2019-01-07 MED ORDER — FENTANYL CITRATE (PF) 250 MCG/5ML IJ SOLN
INTRAMUSCULAR | Status: AC
  Filled 2019-01-07: qty 5

## 2019-01-07 MED ORDER — CEFAZOLIN SODIUM-DEXTROSE 2-3 GM-%(50ML) IV SOLR
INTRAVENOUS | Status: DC | PRN
  Administered 2019-01-07: 2 g via INTRAVENOUS

## 2019-01-07 MED ORDER — ONDANSETRON HCL 4 MG/2ML IJ SOLN
INTRAMUSCULAR | Status: AC
  Filled 2019-01-07: qty 2

## 2019-01-07 MED ORDER — FENTANYL CITRATE (PF) 100 MCG/2ML IJ SOLN
INTRAMUSCULAR | Status: DC | PRN
  Administered 2019-01-07 (×2): 50 ug via INTRAVENOUS
  Administered 2019-01-07: 100 ug via INTRAVENOUS
  Administered 2019-01-07: 50 ug via INTRAVENOUS

## 2019-01-07 MED ORDER — LACTATED RINGERS IV SOLN
INTRAVENOUS | Status: DC
  Administered 2019-01-07: 12:00:00 via INTRAVENOUS

## 2019-01-07 MED ORDER — FENTANYL CITRATE (PF) 100 MCG/2ML IJ SOLN
25.0000 ug | INTRAMUSCULAR | Status: DC | PRN
  Administered 2019-01-07 (×2): 25 ug via INTRAVENOUS

## 2019-01-07 MED ORDER — SUGAMMADEX SODIUM 200 MG/2ML IV SOLN
INTRAVENOUS | Status: AC
  Filled 2019-01-07: qty 2

## 2019-01-07 MED ORDER — OXYCODONE HCL 5 MG PO CAPS: 5.0000 mg | ORAL_CAPSULE | Freq: Four times a day (QID) | ORAL | 0 refills | Status: DC | PRN

## 2019-01-07 MED ORDER — GABAPENTIN 800 MG PO TABS: 800.0000 mg | ORAL_TABLET | Freq: Every day | ORAL | 0 refills | Status: DC

## 2019-01-07 MED ORDER — CEFAZOLIN SODIUM 1 G IJ SOLR
INTRAMUSCULAR | Status: AC
  Filled 2019-01-07: qty 20

## 2019-01-07 MED ORDER — MIDAZOLAM HCL 2 MG/2ML IJ SOLN
INTRAMUSCULAR | Status: AC
  Filled 2019-01-07: qty 2

## 2019-01-07 MED ORDER — SUCCINYLCHOLINE CHLORIDE 20 MG/ML IJ SOLN
INTRAMUSCULAR | Status: AC
  Filled 2019-01-07: qty 1

## 2019-01-07 MED ORDER — PROMETHAZINE HCL 25 MG/ML IJ SOLN
INTRAMUSCULAR | Status: AC
  Administered 2019-01-07: 15:00:00 6.25 mg via INTRAVENOUS
  Filled 2019-01-07: qty 1

## 2019-01-07 MED ORDER — IPRATROPIUM-ALBUTEROL 0.5-2.5 (3) MG/3ML IN SOLN
3.0000 mL | Freq: Once | RESPIRATORY_TRACT | Status: AC
  Administered 2019-01-07: 11:00:00 3 mL via RESPIRATORY_TRACT

## 2019-01-07 MED ORDER — LIDOCAINE HCL (PF) 2 % IJ SOLN
INTRAMUSCULAR | Status: AC
  Filled 2019-01-07: qty 10

## 2019-01-07 MED ORDER — SODIUM CHLORIDE (PF) 0.9 % IJ SOLN
INTRAMUSCULAR | Status: AC
  Filled 2019-01-07: qty 20

## 2019-01-07 MED ORDER — ROCURONIUM BROMIDE 50 MG/5ML IV SOLN
INTRAVENOUS | Status: AC
  Filled 2019-01-07: qty 1

## 2019-01-07 MED ORDER — GLYCOPYRROLATE 0.2 MG/ML IJ SOLN
INTRAMUSCULAR | Status: AC
  Filled 2019-01-07: qty 1

## 2019-01-07 MED ORDER — DEXAMETHASONE SODIUM PHOSPHATE 10 MG/ML IJ SOLN
INTRAMUSCULAR | Status: DC | PRN
  Administered 2019-01-07: 10 mg via INTRAVENOUS

## 2019-01-07 MED ORDER — OXYCODONE HCL 5 MG PO TABS
5.0000 mg | ORAL_TABLET | Freq: Once | ORAL | Status: AC
  Administered 2019-01-07: 5 mg via ORAL

## 2019-01-07 MED ORDER — PROPOFOL 10 MG/ML IV BOLUS
INTRAVENOUS | Status: AC
  Filled 2019-01-07: qty 40

## 2019-01-07 MED ORDER — SUGAMMADEX SODIUM 200 MG/2ML IV SOLN
INTRAVENOUS | Status: DC | PRN
  Administered 2019-01-07: 190 mg via INTRAVENOUS

## 2019-01-07 MED ORDER — PROMETHAZINE HCL 25 MG/ML IJ SOLN
6.2500 mg | Freq: Once | INTRAMUSCULAR | Status: AC
  Administered 2019-01-07: 15:00:00 6.25 mg via INTRAVENOUS

## 2019-01-07 MED ORDER — DOCUSATE SODIUM 100 MG PO CAPS: 100.0000 mg | ORAL_CAPSULE | Freq: Two times a day (BID) | ORAL | 0 refills | Status: DC

## 2019-01-07 MED ORDER — FENTANYL CITRATE (PF) 100 MCG/2ML IJ SOLN
INTRAMUSCULAR | Status: AC
  Administered 2019-01-07: 25 ug via INTRAVENOUS
  Filled 2019-01-07: qty 2

## 2019-01-07 MED ORDER — FAMOTIDINE 20 MG PO TABS
ORAL_TABLET | ORAL | Status: AC
  Filled 2019-01-07: qty 1

## 2019-01-07 MED ORDER — KETOROLAC TROMETHAMINE 30 MG/ML IJ SOLN
INTRAMUSCULAR | Status: AC
  Filled 2019-01-07: qty 1

## 2019-01-07 MED ORDER — ACETAMINOPHEN 500 MG PO TABS: 1000.0000 mg | ORAL_TABLET | Freq: Four times a day (QID) | ORAL | 0 refills | Status: AC

## 2019-01-07 MED ORDER — PHENYLEPHRINE HCL (PRESSORS) 10 MG/ML IV SOLN
INTRAVENOUS | Status: DC | PRN
  Administered 2019-01-07: 100 ug via INTRAVENOUS
  Administered 2019-01-07 (×2): 50 ug via INTRAVENOUS

## 2019-01-07 MED ORDER — ONDANSETRON HCL 4 MG/2ML IJ SOLN
4.0000 mg | Freq: Once | INTRAMUSCULAR | Status: AC
  Administered 2019-01-07: 4 mg via INTRAVENOUS

## 2019-01-07 MED ORDER — DEXAMETHASONE SODIUM PHOSPHATE 10 MG/ML IJ SOLN
INTRAMUSCULAR | Status: AC
  Filled 2019-01-07: qty 1

## 2019-01-07 MED ORDER — PROMETHAZINE HCL 25 MG/ML IJ SOLN
6.2500 mg | Freq: Once | INTRAMUSCULAR | Status: AC
  Administered 2019-01-07: 6.25 mg via INTRAVENOUS

## 2019-01-07 MED ORDER — PHENYLEPHRINE HCL (PRESSORS) 10 MG/ML IV SOLN
INTRAVENOUS | Status: AC
  Filled 2019-01-07: qty 1

## 2019-01-07 MED ORDER — ONDANSETRON HCL 4 MG/2ML IJ SOLN: 4.0000 mg | Freq: Once | INTRAMUSCULAR | Status: DC | PRN

## 2019-01-07 MED ORDER — IBUPROFEN 800 MG PO TABS: 800.0000 mg | ORAL_TABLET | Freq: Three times a day (TID) | ORAL | 1 refills | Status: AC

## 2019-01-07 MED ORDER — OXYCODONE HCL 5 MG PO TABS
ORAL_TABLET | ORAL | Status: AC
  Administered 2019-01-07: 13:00:00 5 mg via ORAL
  Filled 2019-01-07: qty 1

## 2019-01-07 MED ORDER — BUPIVACAINE HCL 0.5 % IJ SOLN
INTRAMUSCULAR | Status: DC | PRN
  Administered 2019-01-07: 13 mL

## 2019-01-07 MED ORDER — PROMETHAZINE HCL 25 MG/ML IJ SOLN
INTRAMUSCULAR | Status: AC
  Filled 2019-01-07: qty 1

## 2019-01-07 MED ORDER — IPRATROPIUM-ALBUTEROL 0.5-2.5 (3) MG/3ML IN SOLN
RESPIRATORY_TRACT | Status: AC
  Administered 2019-01-07: 3 mL via RESPIRATORY_TRACT
  Filled 2019-01-07: qty 3

## 2019-01-07 MED ORDER — ROCURONIUM BROMIDE 100 MG/10ML IV SOLN
INTRAVENOUS | Status: DC | PRN
  Administered 2019-01-07: 20 mg via INTRAVENOUS
  Administered 2019-01-07: 45 mg via INTRAVENOUS
  Administered 2019-01-07: 5 mg via INTRAVENOUS
  Administered 2019-01-07: 20 mg via INTRAVENOUS

## 2019-01-07 MED ORDER — LACTATED RINGERS IV SOLN
INTRAVENOUS | Status: DC
  Administered 2019-01-07: 07:00:00 via INTRAVENOUS

## 2019-01-07 MED ORDER — ACETAMINOPHEN NICU IV SYRINGE 10 MG/ML
INTRAVENOUS | Status: AC
  Filled 2019-01-07: qty 1

## 2019-01-07 MED ORDER — ONDANSETRON HCL 4 MG/2ML IJ SOLN
INTRAMUSCULAR | Status: DC | PRN
  Administered 2019-01-07: 4 mg via INTRAVENOUS

## 2019-01-07 MED ORDER — MIDAZOLAM HCL 2 MG/2ML IJ SOLN
INTRAMUSCULAR | Status: DC | PRN
  Administered 2019-01-07: 2 mg via INTRAVENOUS

## 2019-01-07 SURGICAL SUPPLY — 60 items
BAG URINE DRAINAGE (UROLOGICAL SUPPLIES) ×6 IMPLANT
BLADE SURG SZ11 CARB STEEL (BLADE) ×3 IMPLANT
CATH FOLEY 2WAY  5CC 16FR (CATHETERS) ×1
CATH URTH 16FR FL 2W BLN LF (CATHETERS) ×2 IMPLANT
CHLORAPREP W/TINT 26 (MISCELLANEOUS) ×3 IMPLANT
CORD MONOPOLAR M/FML 12FT (MISCELLANEOUS) ×4 IMPLANT
COUNTER NEEDLE 20/40 LG (NEEDLE) ×3 IMPLANT
COVER LIGHT HANDLE STERIS (MISCELLANEOUS) ×6 IMPLANT
COVER WAND RF STERILE (DRAPES) ×3 IMPLANT
CUP MEDICINE 2OZ PLAST GRAD ST (MISCELLANEOUS) ×3 IMPLANT
DERMABOND ADVANCED (GAUZE/BANDAGES/DRESSINGS) ×1
DERMABOND ADVANCED .7 DNX12 (GAUZE/BANDAGES/DRESSINGS) ×2 IMPLANT
DEVICE SUTURE ENDOST 10MM (ENDOMECHANICALS) ×1 IMPLANT
DRAPE GENERAL ENDO 106X123.5 (DRAPES) ×3 IMPLANT
DRAPE LEGGINS SURG 28X43 STRL (DRAPES) ×3 IMPLANT
DRAPE STERI POUCH LG 24X46 STR (DRAPES) ×3 IMPLANT
DRAPE UNDER BUTTOCK W/FLU (DRAPES) ×3 IMPLANT
DRSG TEGADERM 2-3/8X2-3/4 SM (GAUZE/BANDAGES/DRESSINGS) ×9 IMPLANT
GAUZE 4X4 16PLY RFD (DISPOSABLE) ×3 IMPLANT
GLOVE BIO SURGEON STRL SZ7 (GLOVE) ×9 IMPLANT
GLOVE INDICATOR 7.5 STRL GRN (GLOVE) ×3 IMPLANT
GOWN STRL REUS W/ TWL LRG LVL3 (GOWN DISPOSABLE) ×4 IMPLANT
GOWN STRL REUS W/ TWL XL LVL3 (GOWN DISPOSABLE) ×2 IMPLANT
GOWN STRL REUS W/TWL LRG LVL3 (GOWN DISPOSABLE) ×2
GOWN STRL REUS W/TWL XL LVL3 (GOWN DISPOSABLE) ×1
GRASPER SUT TROCAR 14GX15 (MISCELLANEOUS) ×3 IMPLANT
IRRIGATION STRYKERFLOW (MISCELLANEOUS) IMPLANT
IRRIGATOR STRYKERFLOW (MISCELLANEOUS)
KIT PINK PAD W/HEAD ARE REST (MISCELLANEOUS) ×3
KIT PINK PAD W/HEAD ARM REST (MISCELLANEOUS) ×2 IMPLANT
KIT TURNOVER CYSTO (KITS) ×3 IMPLANT
LABEL OR SOLS (LABEL) ×3 IMPLANT
LIGASURE VESSEL 5MM BLUNT TIP (ELECTROSURGICAL) ×1 IMPLANT
MANIPULATOR VCARE LG CRV RETR (MISCELLANEOUS) IMPLANT
MANIPULATOR VCARE SML CRV RETR (MISCELLANEOUS) ×1 IMPLANT
MANIPULATOR VCARE STD CRV RETR (MISCELLANEOUS) IMPLANT
NS IRRIG 500ML POUR BTL (IV SOLUTION) ×3 IMPLANT
OCCLUDER COLPOPNEUMO (BALLOONS) ×3 IMPLANT
PACK DNC HYST (MISCELLANEOUS) ×3 IMPLANT
PACK GYN LAPAROSCOPIC (MISCELLANEOUS) ×3 IMPLANT
PAD OB MATERNITY 4.3X12.25 (PERSONAL CARE ITEMS) ×3 IMPLANT
PAD PREP 24X41 OB/GYN DISP (PERSONAL CARE ITEMS) ×3 IMPLANT
SCISSORS METZENBAUM CVD 33 (INSTRUMENTS) ×1 IMPLANT
SET CYSTO W/LG BORE CLAMP LF (SET/KITS/TRAYS/PACK) IMPLANT
SLEEVE ENDOPATH XCEL 5M (ENDOMECHANICALS) ×3 IMPLANT
SOL PREP PVP 2OZ (MISCELLANEOUS) ×3
SOLUTION PREP PVP 2OZ (MISCELLANEOUS) ×2 IMPLANT
SPONGE GAUZE 2X2 8PLY STRL LF (GAUZE/BANDAGES/DRESSINGS) ×6 IMPLANT
SURGILUBE 2OZ TUBE FLIPTOP (MISCELLANEOUS) ×3 IMPLANT
SUT ENDO VLOC 180-0-8IN (SUTURE) ×1 IMPLANT
SUT MNCRL 4-0 (SUTURE) ×1
SUT MNCRL 4-0 27XMFL (SUTURE) ×2
SUT VIC AB 0 CT1 36 (SUTURE) ×6 IMPLANT
SUTURE MNCRL 4-0 27XMF (SUTURE) ×2 IMPLANT
SYR 10ML LL (SYRINGE) ×4 IMPLANT
SYR 50ML LL SCALE MARK (SYRINGE) ×3 IMPLANT
TOWEL OR 17X26 4PK STRL BLUE (TOWEL DISPOSABLE) ×3 IMPLANT
TROCAR ENDO BLADELESS 11MM (ENDOMECHANICALS) ×1 IMPLANT
TROCAR XCEL NON-BLD 5MMX100MML (ENDOMECHANICALS) ×3 IMPLANT
TUBING EVAC SMOKE HEATED PNEUM (TUBING) ×3 IMPLANT

## 2019-01-07 NOTE — Op Note (Addendum)
Latoya Kelley PROCEDURE DATE: 01/07/2019  PREOPERATIVE DIAGNOSIS: Chronic pelvic pain POSTOPERATIVE DIAGNOSIS: The same PROCEDURE:  HYSTERECTOMY TOTAL LAPAROSCOPIC, BILATERAL SALPINGECTOMY:  EXAM UNDER ANESTHESIA:   Lysis of adhesions along the left pelvic sidewall  SURGEON:  Dr. Benjaman Kindler ASSISTANT: Dr. Vikki Ports Ward  PA student: Iona Beard Anesthesiologist:  Anesthesiologist: Alvin Critchley, MD CRNA: Demetrius Charity, CRNA; Nelda Marseille, CRNA  INDICATIONS: 23 y.o. F with a long hx of pelvic pain resistant to conservative measures here for definitive surgical management secondary to the indications listed under preoperative diagnoses; please see preoperative note for further details.  Risks of surgery were discussed with the patient including but not limited to: bleeding which may require transfusion or reoperation; infection which may require antibiotics; injury to bowel, bladder, ureters or other surrounding organs; need for additional procedures; thromboembolic phenomenon, incisional problems and other postoperative/anesthesia complications. Written informed consent was obtained.    FINDINGS:  Normal small uterus, tubes and ovaries. Uterus injected with large mesosalpinx vessels and attenuated round ligaments. Adhesions between right sigmoid colon and pelvic sidewall. Normal appendix. Normal upper abdomen.  No IUD strings noted at the internal os. Specimen was not opened per pathology's request. No IUD visualized in the abdomen.   ANESTHESIA:    General INTRAVENOUS FLUIDS:800  ml ESTIMATED BLOOD LOSS:20 ml URINE OUTPUT: 150 ml   SPECIMENS: Uterus, cervix, bilateral fallopian tubes  COMPLICATIONS: None immediate  PROCEDURE IN DETAIL:  The patient received prophalactic intravenous antibiotics and had sequential compression devices applied to her lower extremities while in the preoperative area.  She was then taken to the operating room where general anesthesia was administered and  was found to be adequate.  She was placed in the dorsal lithotomy position, and was prepped and draped in a sterile manner.  A formal time out was performed with all team members present and in agreement.  A V-care uterine manipulator was placed at this time.  A Foley catheter was inserted into her bladder and attached to constant drainage. Attention was turned to the abdomen and 0.5% Marcaine infused subq. A 66mm umbilical incision was made with the scalpel.  The Optiview 5-mm trocar and sleeve were then advanced without difficulty with the laparoscope under direct visualization into the abdomen.  The abdomen was then insufflated with carbon dioxide gas and adequate pneumoperitoneum was obtained.  A survey of the patient's pelvis and abdomen revealed the findings above.  Bilateral lower quadrant ports (5 mm on the right and 11 mm on the left) were then placed under direct visualization.  The pelvis was then carefully examined.  Attention was turned to the fallopian tubes; these were freed from the underlying mesosalpinx and the uterine attachments using the Ligasure device.  The bilateral round and broad ligaments were then clamped and transected with the Ligasure device.  The uterine artery was then skeletonized and a bladder flap was created.  The ureters were noted to be safely away from the area of dissection.  The bladder was then bluntly dissected off the lower uterine segment.    At this point, attention was turned to the uterine vessels, which were clamped and cauterized using the Ligasure on the left, and then the right. After the uterine blood flow at the level of the internal os was controlled, both arteries were cut with the Ligasure.  Good hemostasis was noted overall.  The uterosacral and cardinal ligaments were clamped, cut and ligated bilaterally .  Attention was then turned to the cervicovaginal junction, and monopolar scissors were  used to transect the cervix from the surrounding vagina using  the ring of the V-care as a guide. This was done circumferentially allowing total hysterectomy.  The uterus was then removed from the vagina and the vaginal cuff incision was then closed with running V-loc suture.  Overall excellent hemostasis was noted.    Attention was returned to the abdomen.The ureters were reexamined bilaterally and were pulsating normally. The abdominal pressure was reduced and hemostasis was confirmed.     The 93mm port fascia was closed with a vertical mattress with 0-Vicryl, using the cone closure system. All trocars were removed under direct visualization, and the abdomen was desufflated.  All skin incisions were closed with 4-0 Vicryl subcuticular stitches and steri-strips. The patient tolerated the procedures well.  All instruments, needles, and sponge counts were correct x 2. The patient was taken to the recovery room awake, extubated and in stable condition.   At her request, pictures were taken of the specimen outside and inside her body.

## 2019-01-07 NOTE — Anesthesia Post-op Follow-up Note (Signed)
Anesthesia QCDR form completed.        

## 2019-01-07 NOTE — Anesthesia Procedure Notes (Signed)
Procedure Name: Intubation Performed by: Demetrius Charity, CRNA Pre-anesthesia Checklist: Patient identified, Patient being monitored, Timeout performed, Emergency Drugs available and Suction available Patient Re-evaluated:Patient Re-evaluated prior to induction Oxygen Delivery Method: Circle system utilized Preoxygenation: Pre-oxygenation with 100% oxygen Induction Type: IV induction Ventilation: Mask ventilation without difficulty and Oral airway inserted - appropriate to patient size Laryngoscope Size: 3 and McGraph Grade View: Grade I Tube type: Oral Tube size: 7.0 mm Number of attempts: 1 Airway Equipment and Method: Stylet and Video-laryngoscopy Placement Confirmation: ETT inserted through vocal cords under direct vision,  positive ETCO2 and breath sounds checked- equal and bilateral Secured at: 22 cm Tube secured with: Tape Dental Injury: Teeth and Oropharynx as per pre-operative assessment  Difficulty Due To: Difficult Airway- due to dentition

## 2019-01-07 NOTE — Progress Notes (Addendum)
Pt complained of 10 out of 10 pain. Fentanyl 25mg  x 2 given. Pt sleeping between care; oxygen dropping to 88-89% while asleep. Spoke with Dr. Ola Spurr who advises to give oxycodone 5mg  PO once. Also advised to give phenergan 6.25mg  once if continued complaints of nausea. Dr. Kayleen Memos had also given phenergan order.  Pt tolerating ginger ale and crackers; feeling nauseated. Phenergan 6.25mg  given once. Pt sleeping

## 2019-01-07 NOTE — Interval H&P Note (Signed)
History and Physical Interval Note:  01/07/2019 7:52 AM  Latoya Kelley  has presented today for surgery, with the diagnosis of Pelvic pain, Menorrhagia.  The various methods of treatment have been discussed with the patient and family. After consideration of risks, benefits and other options for treatment, the patient has consented to  Procedure(s): HYSTERECTOMY TOTAL LAPAROSCOPIC, BILATERAL SALPINGECTOMY (Bilateral) EXAM UNDER ANESTHESIA (N/A) as a surgical intervention.  The patient's history has been reviewed, patient examined, no change in status, stable for surgery.  I have reviewed the patient's chart and labs.  Questions were answered to the patient's satisfaction.     Benjaman Kindler

## 2019-01-07 NOTE — Progress Notes (Signed)
Pt sleeping between care. Awakens and answers questions appropriately. VSS. PO oxycodone given. Will transport to [place]

## 2019-01-07 NOTE — Anesthesia Preprocedure Evaluation (Signed)
Anesthesia Evaluation  Patient identified by MRN, date of birth, ID band Patient awake    Reviewed: Allergy & Precautions, NPO status , Patient's Chart, lab work & pertinent test results  Airway Mallampati: II  TM Distance: >3 FB     Dental  (+) Teeth Intact   Pulmonary Current Smoker,    Pulmonary exam normal        Cardiovascular negative cardio ROS Normal cardiovascular exam     Neuro/Psych  Headaches, PSYCHIATRIC DISORDERS Anxiety Depression    GI/Hepatic Neg liver ROS, GERD  ,  Endo/Other  negative endocrine ROS  Renal/GU negative Renal ROS  Female GU complaint     Musculoskeletal negative musculoskeletal ROS (+)   Abdominal Normal abdominal exam  (+)   Peds negative pediatric ROS (+)  Hematology   Anesthesia Other Findings Past Medical History: No date: Anxiety No date: Cancer ([place])     Comment:  melanoma No date: Depression No date: Factor 5 Leiden mutation, heterozygous ([place]) No date: GERD (gastroesophageal reflux disease)     Comment:  history by endo No date: Headache     Comment:  severe ha's sinus related No date: Panic attack  Reproductive/Obstetrics                             Anesthesia Physical Anesthesia Plan  ASA: II  Anesthesia Plan: General   Post-op Pain Management:    Induction: Intravenous  PONV Risk Score and Plan:   Airway Management Planned: Oral ETT  Additional Equipment:   Intra-op Plan:   Post-operative Plan: Extubation in OR  Informed Consent: I have reviewed the patients History and Physical, chart, labs and discussed the procedure including the risks, benefits and alternatives for the proposed anesthesia with the patient or authorized representative who has indicated his/her understanding and acceptance.     Dental advisory given  Plan Discussed with: Surgeon and CRNA  Anesthesia Plan Comments:         Anesthesia Quick  Evaluation

## 2019-01-07 NOTE — Discharge Instructions (Signed)
Laparoscopic Ovarian Surgery Discharge Instructions  For the next three days, take ibuprofen and acetaminophen on a schedule, every 8 hours. You can take them together or you can intersperse them, and take one every four hours. I also gave you gabapentin for nighttime, to help you sleep and also to control pain. Take gabapentin medicines at night for at least the next 3 nights. You also have a narcotic, oxycodone, to take as needed if the above medicines don't help.  Postop constipation is a major cause of pain. Stay well hydrated, walk as you tolerate, and take over the counter senna as well as stool softeners if you need them.   RISKS AND COMPLICATIONS   Infection.  Bleeding.  Injury to surrounding organs.  Anesthetic side effects.   PROCEDURE   You may be given a medicine to help you relax (sedative) before the procedure. You will be given a medicine to make you sleep (general anesthetic) during the procedure.  A tube will be put down your throat to help your breath while under general anesthesia.  Several small cuts (incisions) are made in the lower abdominal area and one incision is made near the belly button.  Your abdominal area will be inflated with a safe gas (carbon dioxide). This helps give the surgeon room to operate, visualize, and helps the surgeon avoid other organs.  A thin, lighted tube (laparoscope) with a camera attached is inserted into your abdomen through the incision near the belly button. Other small instruments may also be inserted through other abdominal incisions.  The ovary is located and are removed.  After the ovary is removed, the gas is released from the abdomen.  The incisions will be closed with stitches (sutures), and Dermabond. A bandage may be placed over the incisions.  AFTER THE PROCEDURE   You will also have some mild abdominal discomfort for 3-7 days. You will be given pain medicine to ease any discomfort.  As long as there are no  problems, you may be allowed to go home. Someone will need to drive you home and be with you for at least 24 hours once home.  You may have some mild discomfort in the throat. This is from the tube placed in your throat while you were sleeping.  You may experience discomfort in the shoulder area from some trapped air between the liver and diaphragm. This sensation is normal and will slowly go away on its own.  HOME CARE INSTRUCTIONS   Take all medicines as directed.  Only take over-the-counter or prescription medicines for pain, discomfort, or fever as directed by your caregiver.  Resume daily activities as directed.  Showers are preferred over baths for 2 weeks.  You may resume sexual activities in 1 week or as you feel you would like to.  Do not drive while taking narcotics.  SEEK MEDICAL CARE IF: .  There is increasing abdominal pain.  You feel lightheaded or faint.  You have the chills.  You have an oral temperature above 102 F (38.9 C).  There is pus-like (purulent) drainage from any of the wounds.  You are unable to pass gas or have a bowel movement.  You feel sick to your stomach (nauseous) or throw up (vomit) and can't control it with your medicines.  MAKE SURE YOU:   Understand these instructions.  Will watch your condition.  Will get help right away if you are not doing well or get worse.  ExitCare Patient Information 2013 [employer]     AMBULATORY SURGERY  DISCHARGE INSTRUCTIONS   1) The drugs that you were given will stay in your system until tomorrow so for the next 24 hours you should not:  A) Drive an automobile B) Make any legal decisions C) Drink any alcoholic beverage   2) You may resume regular meals tomorrow.  Today it is better to start with liquids and gradually work up to solid foods.  You may eat anything you prefer, but it is better to start with liquids, then soup and crackers, and gradually work up to solid  foods.   3) Please notify your doctor immediately if you have any unusual bleeding, trouble breathing, redness and pain at the surgery site, drainage, fever, or pain not relieved by medication.    4) Additional Instructions:        Please contact your physician with any problems or Same Day Surgery at [phone], Monday through Friday 6 am to 4 pm, or [facility] [place] [facility] [place] Main number at [phone].   

## 2019-01-07 NOTE — Transfer of Care (Addendum)
Immediate Anesthesia Transfer of Care Note  Patient: Latoya Kelley  Procedure(s) Performed: HYSTERECTOMY TOTAL LAPAROSCOPIC, BILATERAL SALPINGECTOMY (Bilateral Abdomen) EXAM UNDER ANESTHESIA (N/A Vagina )  Patient Location: PACU  Anesthesia Type:General  Level of Consciousness: awake and alert   Airway & Oxygen Therapy: Pt connected to bipap per RT  Post-op Assessment: Report given to RN and Post -op Vital signs reviewed and stable  Post vital signs: Reviewed and stable  Last Vitals:  Vitals Value Taken Time  BP    Temp    Pulse 102 01/07/19 1101  Resp 12 01/07/19 1101  SpO2 97 % 01/07/19 1101  Vitals shown include unvalidated device data.  Last Pain:  Vitals:   01/07/19 0618  TempSrc: Tympanic  PainSc: 8          Complications: No apparent anesthesia complications

## 2019-01-08 LAB — SURGICAL PATHOLOGY

## 2019-01-09 NOTE — Anesthesia Postprocedure Evaluation (Signed)
Anesthesia Post Note  Patient: Zamyia B Ciocca  Procedure(s) Performed: HYSTERECTOMY TOTAL LAPAROSCOPIC, BILATERAL SALPINGECTOMY (Bilateral Abdomen) EXAM UNDER ANESTHESIA (N/A Vagina )  Patient location during evaluation: Other Anesthesia Type: General Level of consciousness: awake and alert and oriented Pain management: pain level controlled Vital Signs Assessment: post-procedure vital signs reviewed and stable Respiratory status: spontaneous breathing Cardiovascular status: blood pressure returned to baseline Anesthetic complications: no     Last Vitals:  Vitals:   01/07/19 1446 01/07/19 1532  BP: 123/86 (!) 142/89  Pulse: 97 84  Resp: 16 16  Temp:    SpO2: 99% 100%    Last Pain:  Vitals:   01/08/19 0853  TempSrc:   PainSc: 3                  Kiyara Bouffard

## 2019-03-18 ENCOUNTER — Encounter: Payer: Self-pay | Admitting: Emergency Medicine

## 2019-03-18 ENCOUNTER — Other Ambulatory Visit: Payer: Self-pay

## 2019-03-18 ENCOUNTER — Ambulatory Visit
Admission: EM | Admit: 2019-03-18 | Discharge: 2019-03-18 | Disposition: A | Discharge status: Home / Self Care | Payer: Managed Care, Other (non HMO)

## 2019-03-18 DIAGNOSIS — H6691 Otitis media, unspecified, right ear: Secondary | ICD-10-CM

## 2019-03-18 MED ORDER — AMOXICILLIN 875 MG PO TABS: 875.0000 mg | ORAL_TABLET | Freq: Two times a day (BID) | ORAL | 0 refills | Status: DC

## 2019-03-18 NOTE — ED Provider Notes (Signed)
[facility] ____________________________________________  Time seen: Approximately 1:46 PM  I have reviewed the triage vital signs and the nursing notes.   HISTORY  Chief Complaint No chief complaint on file.  HPI Latoya Kelley is a 23 y.o. female presenting for evaluation of right ear pain present for the last 2 weeks, gradual in onset.  Reports some left ear discomfort, but right greater than left.  Patient reports she has recurrent ear infection.  States seasonal allergies usually present as ear infections.  Denies recent cough, congestion, sore throat, fevers, chest pain or shortness of breath.  Continues eat and drink well.  Denies aggravating or alleviating factors.  Does not currently take any allergy medication now.  Reports otherwise doing well.    Past Medical History:  Diagnosis Date  . Anxiety   . Cancer ([place])    melanoma  . Depression   . Factor 5 Leiden mutation, heterozygous ([place])   . GERD (gastroesophageal reflux disease)    history by endo  . Headache    severe ha's sinus related  . Panic attack     Patient Active Problem List   Diagnosis Date Noted  . 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 Erythrocytosis 10/26/2016    Past Surgical History:  Procedure Laterality Date  . ENDOMETRIAL ABLATION W/ NOVASURE    . INTRAUTERINE DEVICE (IUD) INSERTION N/A 11/14/2014   Procedure: INTRAUTERINE DEVICE (IUD) INSERTION;  Surgeon: Benjaman Kindler, MD;  Location: [facility];  Service: Gynecology;  Laterality: N/A;  . IUD REMOVAL N/A 11/14/2014   Procedure: INTRAUTERINE DEVICE (IUD) REMOVAL;  Surgeon: Benjaman Kindler, MD;  Location: [facility];  Service: Gynecology;  Laterality: N/A;  . LAPAROSCOPIC HYSTERECTOMY Bilateral 01/07/2019   Procedure: HYSTERECTOMY TOTAL LAPAROSCOPIC, BILATERAL SALPINGECTOMY;  Surgeon: Benjaman Kindler, MD;  Location: [facility];  Service: Gynecology;  Laterality: Bilateral;  . LAPAROSCOPY N/A 11/14/2014   Procedure: LAPAROSCOPY  DIAGNOSTIC;  Surgeon: Benjaman Kindler, MD;  Location: [facility];  Service: Gynecology;  Laterality: N/A;  . MELANOMA EXCISION     arm  . UPPER GI ENDOSCOPY    . WISDOM TOOTH EXTRACTION       No current facility-administered medications for this encounter.   Current Outpatient Medications:  .  ARIPiprazole (ABILIFY) 5 MG tablet, TAKE 1 2 (ONE HALF) TABLET BY MOUTH ONCE DAILY FOR 7 DAYS THEN INCREASE TO 1 TABLET ONCE DAILY, Disp: , Rfl:  .  escitalopram (LEXAPRO) 20 MG tablet, Take 20 mg by mouth at bedtime., Disp: , Rfl:  .  amoxicillin (AMOXIL) 875 MG tablet, Take 1 tablet (875 mg total) by mouth 2 (two) times daily., Disp: 20 tablet, Rfl: 0 .  OVER THE COUNTER MEDICATION, Take 1 capsule by mouth at bedtime as needed (sleep). "stress balls" otc sleep aid, Disp: , Rfl:   Allergies Other  Family History  Problem Relation Age of Onset  . Hypertension Mother   . Factor V Leiden deficiency Mother   . Factor V Leiden deficiency Sister   . Factor V Leiden deficiency Maternal Grandmother   . Skin cancer Maternal Grandmother   . Lymphoma Paternal Grandfather   . Factor V Leiden deficiency Sister     Social History Social History   Tobacco Use  . Smoking status: Current Every Day Smoker    Packs/day: 1.00    Years: 4.00    Pack years: 4.00    Types: Cigarettes  . Smokeless tobacco: Never Used  Substance Use Topics  . Alcohol use: Yes  Comment: socially  . Drug use: No    Review of Systems Constitutional: No fever ENT: No sore throat.  Positive ear pain. Cardiovascular: Denies chest pain. Respiratory: Denies shortness of breath. Musculoskeletal: Negative for back pain. Skin: Negative for rash.   ____________________________________________   PHYSICAL EXAM:  VITAL SIGNS: ED Triage Vitals  Enc Vitals Group     BP 03/18/19 1314 123/82     Pulse Rate 03/18/19 1314 88     Resp 03/18/19 1314 18     Temp 03/18/19 1314 98.6 F (37 C)     Temp src --      SpO2  03/18/19 1314 99 %     Weight 03/18/19 1310 215 lb (97.5 kg)     Height 03/18/19 1310 5\' 5"  (1.651 m)     Head Circumference --      Peak Flow --      Pain Score 03/18/19 1310 6     Pain Loc --      Pain Edu? --      Excl. in [place]? --    Constitutional: Alert and oriented. Well appearing and in no acute distress. Eyes: Conjunctivae are normal.  Head: Atraumatic. No sinus tenderness to palpation. No swelling. No erythema.  Ears: No mastoid tenderness bilaterally.  Left: Nontender, normal canal, no erythema, slight effusion, otherwise normal TM.  Right: Mild tenderness auricle movement, normal canal, moderate erythema with effusion present. Hematological/Lymphatic/Immunilogical: No cervical lymphadenopathy. Cardiovascular: Normal rate, regular rhythm. Grossly normal heart sounds.  Good peripheral circulation. Respiratory: Normal respiratory effort.  No retractions. No wheezes, rales or rhonchi. Good air movement.  Musculoskeletal: Ambulatory with steady gait.  Neurologic:  Normal speech and language. No gait instability. Skin:  Skin appears warm, dry and intact. No rash noted. Psychiatric: Mood and affect are normal. Speech and behavior are normal. ___________________________________________   LABS (all labs ordered are listed, but only abnormal results are displayed)  Labs Reviewed - No data to display  PROCEDURES Procedures   INITIAL IMPRESSION / ASSESSMENT AND PLAN / ED COURSE  Pertinent labs & imaging results that were available during my care of the patient were reviewed by me and considered in my medical decision making (see chart for details).  Well-appearing patient.  No acute distress.  Right otitis media.  Will treat with oral amoxicillin.  Encourage over-the-counter Claritin and decongestant.  Supportive care.Discussed indication, risks and benefits of medications with patient.  Discussed follow up with Primary care physician this week. Discussed follow up and return  parameters including no resolution or any worsening concerns. Patient verbalized understanding and agreed to plan.   ____________________________________________   FINAL CLINICAL IMPRESSION(S) / ED DIAGNOSES  Final diagnoses:  Right otitis media, unspecified otitis media type     ED Discharge Orders         Ordered    amoxicillin (AMOXIL) 875 MG tablet  2 times daily     03/18/19 1333           Note: This dictation was prepared with Dragon dictation along with smaller phrase technology. Any transcriptional errors that result from this process are unintentional.         Marylene Land, NP 03/18/19 1355

## 2019-03-18 NOTE — ED Triage Notes (Signed)
Patient in office for ear pain (right) x2wks Patient states she has a history of ear infection  [id number]

## 2019-03-18 NOTE — Discharge Instructions (Addendum)
Take medication as prescribed. Rest. Drink plenty of fluids.  ° °Follow up with your primary care physician this week as needed. Return to Urgent care for new or worsening concerns.  ° °

## 2019-03-28 ENCOUNTER — Encounter: Payer: Self-pay | Admitting: Emergency Medicine

## 2019-03-28 ENCOUNTER — Other Ambulatory Visit: Payer: Self-pay

## 2019-03-28 ENCOUNTER — Ambulatory Visit
Admission: EM | Admit: 2019-03-28 | Discharge: 2019-03-28 | Disposition: A | Discharge status: Home / Self Care | Payer: Managed Care, Other (non HMO) | Attending: Family Medicine | Admitting: Family Medicine

## 2019-03-28 DIAGNOSIS — M79641 Pain in right hand: Secondary | ICD-10-CM

## 2019-03-28 DIAGNOSIS — M25532 Pain in left wrist: Secondary | ICD-10-CM

## 2019-03-28 DIAGNOSIS — M25531 Pain in right wrist: Secondary | ICD-10-CM

## 2019-03-28 DIAGNOSIS — M79642 Pain in left hand: Secondary | ICD-10-CM

## 2019-03-28 DIAGNOSIS — W01198A Fall on same level from slipping, tripping and stumbling with subsequent striking against other object, initial encounter: Secondary | ICD-10-CM

## 2019-03-28 MED ORDER — MELOXICAM 15 MG PO TABS: 15.0000 mg | ORAL_TABLET | Freq: Every day | ORAL | 0 refills | Status: AC | PRN

## 2019-03-28 NOTE — ED Provider Notes (Signed)
[facility]    CSN: [id number] Arrival date & time: 03/28/19  1227      History   Chief Complaint Chief Complaint  Patient presents with  . Fall    APPT DOI 03/25/19  . Wrist Injury   HPI  22 year old female presents with the above complaints.  Patient suffered a fall on 11/1.  She states that she fell after stepping in a dog food bowl.  She fell backward on outstretched hands.  Patient reports that she is having bilateral hand and wrist pain.  Reports associated decreased hand grip strength.  Pain is mild to moderate.  She reports associated swelling.  She has taken Tylenol without resolution.  Exacerbated by activity.  No relieving factors.  No other complaints.  PMH, Surgical Hx, Family Hx, Social History reviewed and updated as below.  Past Medical History:  Diagnosis Date  . Anxiety   . Cancer ([place])    melanoma  . Depression   . Factor 5 Leiden mutation, heterozygous ([place])   . GERD (gastroesophageal reflux disease)    history by endo  . Headache    severe ha's sinus related  . Panic attack    Patient Active Problem List   Diagnosis Date Noted  . 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 Erythrocytosis 10/26/2016   Past Surgical History:  Procedure Laterality Date  . ABDOMINAL HYSTERECTOMY    . ENDOMETRIAL ABLATION W/ NOVASURE    . INTRAUTERINE DEVICE (IUD) INSERTION N/A 11/14/2014   Procedure: INTRAUTERINE DEVICE (IUD) INSERTION;  Surgeon: Benjaman Kindler, MD;  Location: [facility];  Service: Gynecology;  Laterality: N/A;  . IUD REMOVAL N/A 11/14/2014   Procedure: INTRAUTERINE DEVICE (IUD) REMOVAL;  Surgeon: Benjaman Kindler, MD;  Location: [facility];  Service: Gynecology;  Laterality: N/A;  . LAPAROSCOPIC HYSTERECTOMY Bilateral 01/07/2019   Procedure: HYSTERECTOMY TOTAL LAPAROSCOPIC, BILATERAL SALPINGECTOMY;  Surgeon: Benjaman Kindler, MD;  Location: [facility];  Service: Gynecology;  Laterality: Bilateral;  . LAPAROSCOPY N/A 11/14/2014   Procedure:  LAPAROSCOPY DIAGNOSTIC;  Surgeon: Benjaman Kindler, MD;  Location: [facility];  Service: Gynecology;  Laterality: N/A;  . MELANOMA EXCISION     arm  . UPPER GI ENDOSCOPY    . WISDOM TOOTH EXTRACTION     OB History   No obstetric history on file.    Home Medications    Prior to Admission medications   Medication Sig Start Date End Date Taking? Authorizing Provider  ARIPiprazole (ABILIFY) 5 MG tablet TAKE 1 2 (ONE HALF) TABLET BY MOUTH ONCE DAILY FOR 7 DAYS THEN INCREASE TO 1 TABLET ONCE DAILY 02/01/19  Yes [provider]  escitalopram (LEXAPRO) 20 MG tablet Take 20 mg by mouth at bedtime.   Yes [provider]  meloxicam (MOBIC) 15 MG tablet Take 1 tablet (15 mg total) by mouth daily as needed. 03/28/19   Johngabriel Verde, Barnie Del, DO  OVER THE COUNTER MEDICATION Take 1 capsule by mouth at bedtime as needed (sleep). "stress balls" otc sleep aid    [provider]  gabapentin (NEURONTIN) 800 MG tablet Take 1 tablet (800 mg total) by mouth at bedtime for 14 days. Take nightly for 3 days, then up to 14 days as needed 01/07/19 03/18/19  Benjaman Kindler, MD    Family History Family History  Problem Relation Age of Onset  . Hypertension Mother   . Factor V Leiden deficiency Mother   . Osteoarthritis Mother   . Factor V Leiden deficiency Sister   . Factor V  Leiden deficiency Maternal Grandmother   . Skin cancer Maternal Grandmother   . Lymphoma Paternal Grandfather   . Factor V Leiden deficiency Sister   . Other Father        unknown medical history    Social History Social History   Tobacco Use  . Smoking status: Current Every Day Smoker    Packs/day: 1.00    Years: 4.00    Pack years: 4.00    Types: Cigarettes  . Smokeless tobacco: Never Used  Substance Use Topics  . Alcohol use: Yes    Comment: socially  . Drug use: No     Allergies   Other   Review of Systems Review of Systems  Constitutional: Negative.   Musculoskeletal:       Hand and wrist pain.    Physical Exam Triage Vital Signs ED Triage Vitals  Enc Vitals Group     BP 03/28/19 1252 114/76     Pulse Rate 03/28/19 1252 88     Resp 03/28/19 1252 18     Temp 03/28/19 1252 98.1 F (36.7 C)     Temp Source 03/28/19 1252 Oral     SpO2 03/28/19 1252 99 %     Weight 03/28/19 1252 220 lb (99.8 kg)     Height 03/28/19 1252 5\' 5"  (1.651 m)     Head Circumference --      Peak Flow --      Pain Score 03/28/19 1336 0     Pain Loc --      Pain Edu? --      Excl. in [place]? --    Updated Vital Signs BP 114/76 (BP Location: Left Arm)   Pulse 88   Temp 98.1 F (36.7 C) (Oral)   Resp 18   Ht 5\' 5"  (1.651 m)   Wt 99.8 kg   SpO2 99%   BMI 36.61 kg/m   Visual Acuity Right Eye Distance:   Left Eye Distance:   Bilateral Distance:    Right Eye Near:   Left Eye Near:    Bilateral Near:     Physical Exam Vitals signs and nursing note reviewed.  Constitutional:      General: She is not in acute distress.    Appearance: Normal appearance. She is not ill-appearing.  HENT:     Head: Normocephalic and atraumatic.  Eyes:     General:        Right eye: No discharge.        Left eye: No discharge.     Conjunctiva/sclera: Conjunctivae normal.  Cardiovascular:     Rate and Rhythm: Normal rate and regular rhythm.     Heart sounds: No murmur.  Pulmonary:     Effort: Pulmonary effort is normal. No respiratory distress.  Musculoskeletal:     Comments: Diffuse tenderness throughout the hands and wrists bilaterally.  No focality to her exam.  No appreciable swelling.  Normal range of motion.  Neurological:     Mental Status: She is alert.  Psychiatric:        Mood and Affect: Mood normal.        Behavior: Behavior normal.    UC Treatments / Results  Labs (all labs ordered are listed, but only abnormal results are displayed) Labs Reviewed - No data to display  EKG   Radiology No results found.  Procedures Procedures (including critical care time)  Medications Ordered in  UC Medications - No data to display  Initial Impression / Assessment  and Plan / UC Course  I have reviewed the triage vital signs and the nursing notes.  Pertinent labs & imaging results that were available during my care of the patient were reviewed by me and considered in my medical decision making (see chart for details).    23 year old female presents with hand and wrist pain after suffering a fall.  No indications for x-ray given no focality to her exam.  Advised rest and use of home bracing.  Ice.  Meloxicam as directed.  Supportive care.  Final Clinical Impressions(s) / UC Diagnoses   Final diagnoses:  Pain in both hands  Pain in both wrists     Discharge Instructions     Rest.  Bracing.  Ice.  Medication as prescribed.  Take care  Dr. Lacinda Axon    ED Prescriptions    Medication Sig Dispense Auth. Provider   meloxicam (MOBIC) 15 MG tablet Take 1 tablet (15 mg total) by mouth daily as needed. 30 tablet Coral Spikes, DO     PDMP not reviewed this encounter.   Coral Spikes, DO 03/28/19 1501

## 2019-03-28 NOTE — ED Triage Notes (Signed)
Patient in today after falling on 03/24/19 and catching herself on her wrists. Patient is now having bilateral wrist and hand pain. 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

## 2019-03-28 NOTE — Discharge Instructions (Signed)
Rest.  Bracing.  Ice.  Medication as prescribed.  Take care  Dr. Lacinda Axon

## 2019-03-28 NOTE — ED Triage Notes (Signed)
Patient states she has braces for her CTS, but is in the process of moving and doesn't know where they are.

## 2021-01-29 IMAGING — CR DG LUMBAR SPINE COMPLETE 4+V
5 series · 5 of 5 positions shown · non-contrast
Comparison: None.

CLINICAL DATA: Low back pain and left hip pain. The patient fell
today and 3 days ago.

EXAM:
LUMBAR SPINE - COMPLETE 4+ VIEW

[l-spine ap]
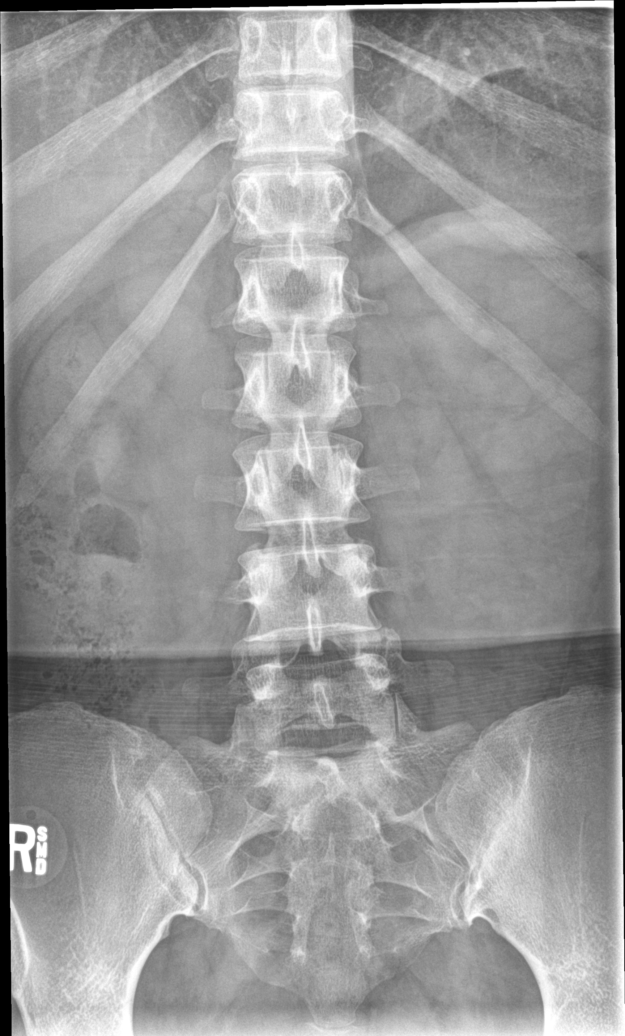

[l-spine obl (1 of 2)]
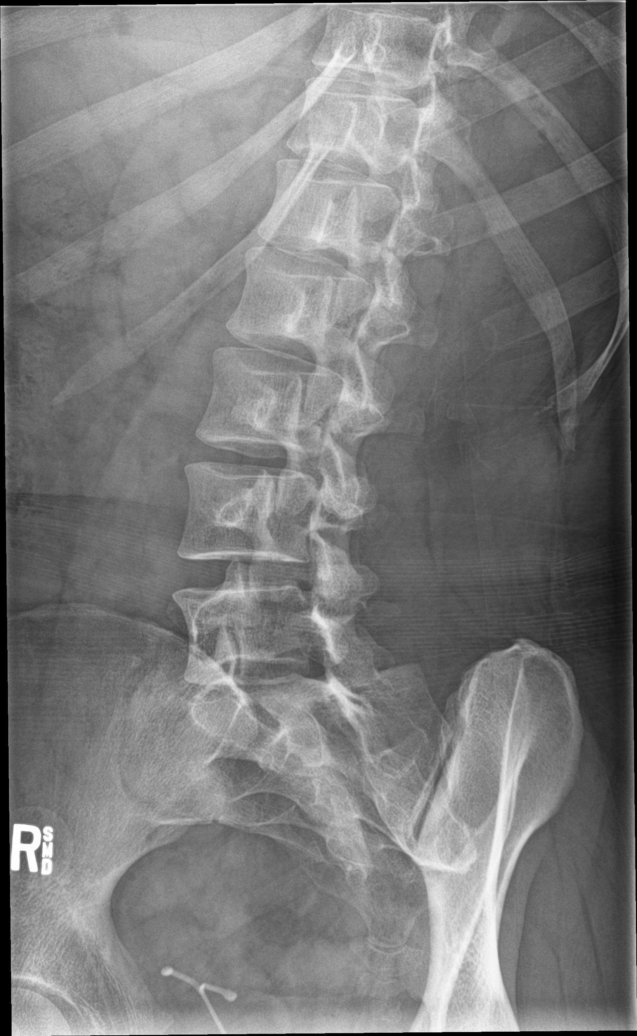

[l-spine obl (2 of 2)]
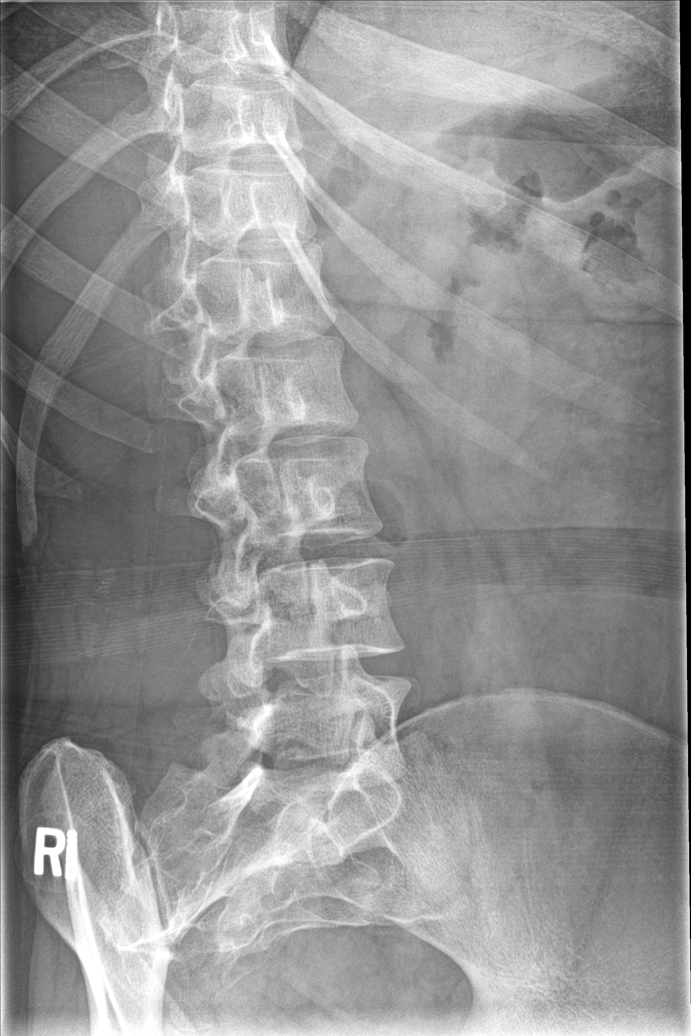

[l-spine lat]
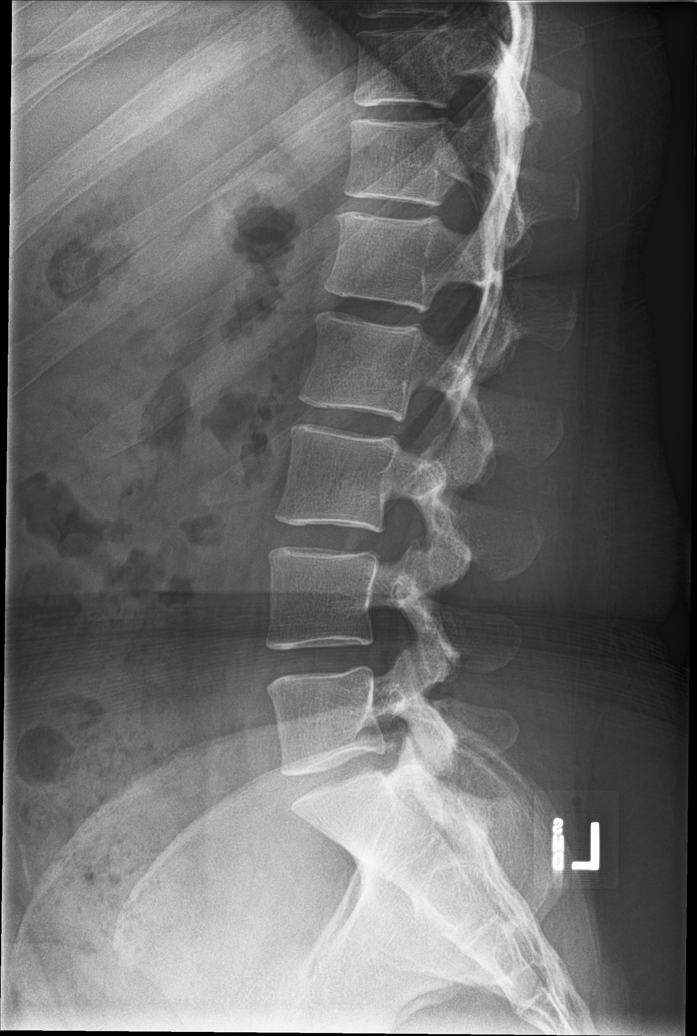

[l-spine spot]
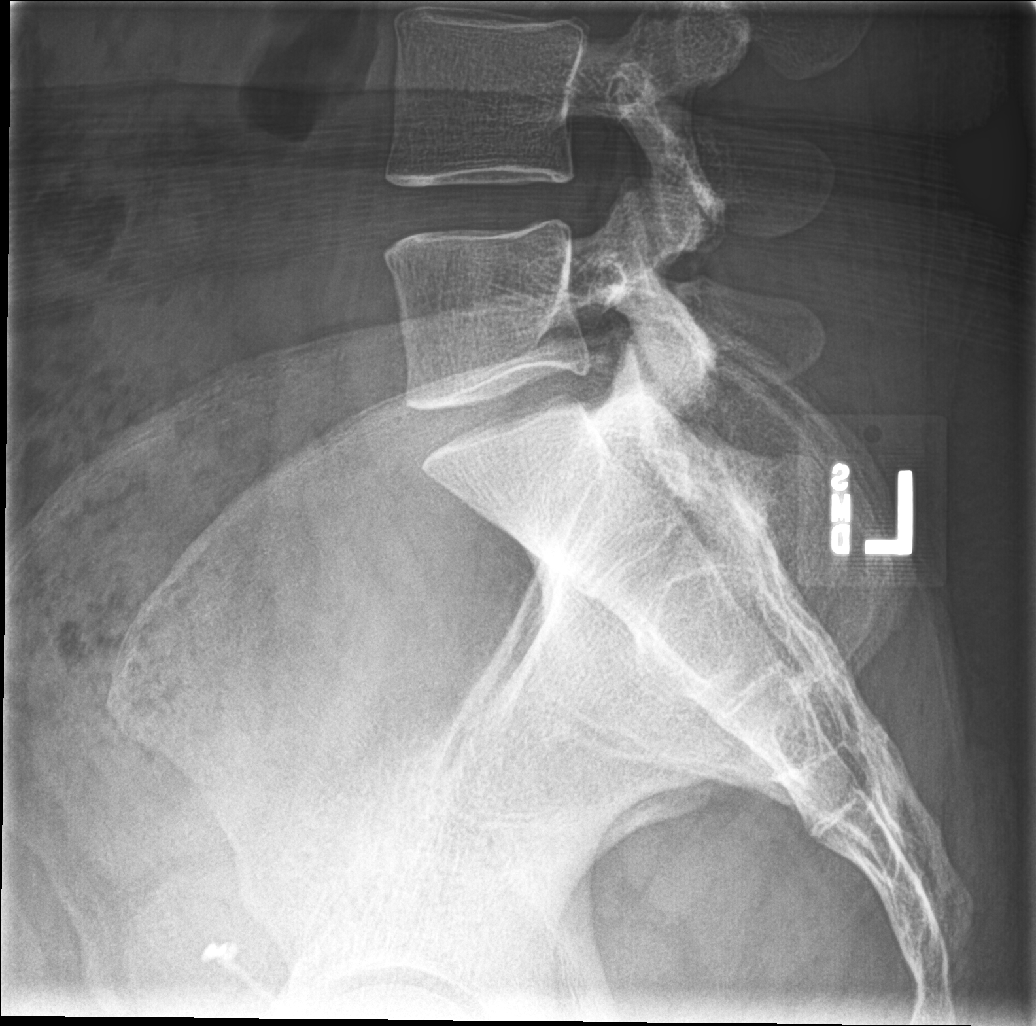

[5 of 5 positions shown; findings below may reference images not displayed]

FINDINGS: There is no evidence of lumbar spine fracture. Alignment is normal.
Intervertebral disc spaces are maintained. No facet arthritis.
IMPRESSION: Normal lumbar spine.
